# Patient Record
Sex: Female | Born: 1945 | Race: White | Hispanic: No | Marital: Married | Smoking: Never smoker
Health system: Southern US, Community
[De-identification: ages and names within clinical notes are randomized; demographics above are authoritative.]

## PROBLEM LIST (undated history)

## (undated) DIAGNOSIS — C801 Malignant (primary) neoplasm, unspecified: Secondary | ICD-10-CM

## (undated) DIAGNOSIS — I10 Essential (primary) hypertension: Secondary | ICD-10-CM

## (undated) DIAGNOSIS — C569 Malignant neoplasm of unspecified ovary: Secondary | ICD-10-CM

## (undated) DIAGNOSIS — E119 Type 2 diabetes mellitus without complications: Secondary | ICD-10-CM

## (undated) DIAGNOSIS — I499 Cardiac arrhythmia, unspecified: Secondary | ICD-10-CM

## (undated) DIAGNOSIS — E785 Hyperlipidemia, unspecified: Secondary | ICD-10-CM

## (undated) DIAGNOSIS — I2510 Atherosclerotic heart disease of native coronary artery without angina pectoris: Secondary | ICD-10-CM

## (undated) HISTORY — PX: CARDIAC CATHETERIZATION: SHX172

---
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
## 2013-07-01 DIAGNOSIS — C569 Malignant neoplasm of unspecified ovary: Secondary | ICD-10-CM

## 2013-07-01 HISTORY — DX: Malignant neoplasm of unspecified ovary: C56.9

## 2013-07-01 HISTORY — PX: ABDOMINAL HYSTERECTOMY: SHX81

## 2019-01-06 DIAGNOSIS — I499 Cardiac arrhythmia, unspecified: Secondary | ICD-10-CM

## 2019-01-06 HISTORY — DX: Cardiac arrhythmia, unspecified: I49.9

## 2019-01-06 HISTORY — PX: CORONARY ARTERY BYPASS GRAFT: SHX141

## 2019-06-17 ENCOUNTER — Telehealth (HOSPITAL_COMMUNITY): Payer: Self-pay

## 2019-06-17 NOTE — Telephone Encounter (Signed)
Called patient to see if she is interested in the Cardiac Rehab Program. Patient expressed interest. Explained scheduling process and went over insurance, patient verbalized understanding.   Pt will come in for orientation on 06/30/2019 @ 2PM and will attend the 315PM class.  Mailed letter

## 2019-06-17 NOTE — Telephone Encounter (Signed)
Pt insurance is active and benefits verified through Medicare A/B. Co-pay $0.00, DED $198.00/$198.00 met, out of pocket $0.00/$0.00 met, co-insurance 20%. No pre-authorization required. Passport, 06/17/2019 @ 10:16AM, VEH#[id number]  Pt 2ndary insurance [employer] [facility] is out of network, [facility] 06/17/2019. TML#[id number]

## 2019-06-25 ENCOUNTER — Telehealth (HOSPITAL_COMMUNITY): Payer: Self-pay | Admitting: Pharmacist

## 2019-06-25 NOTE — Telephone Encounter (Signed)
Cardiac Rehab Medication Review by a Pharmacist  Does the patient feel that his/her medications are working for him/her?  no  Has the patient been experiencing any side effects to the medications prescribed?  no  Does the patient measure his/her own blood pressure or blood glucose at home?  yes   Does the patient have any problems obtaining medications due to transportation or finances?   no  Understanding of regimen: good Understanding of indications: good Potential of compliance: good  Pharmacist comments: Patient has concerns regarding her metoprolol succinate and atorvastatin and prefers asking her cardiologist her questions.  Richardine Service, PharmD PGY1 Pharmacy Resident Phone: [phone] 06/25/2019  4:46 PM

## 2019-06-30 ENCOUNTER — Encounter (HOSPITAL_COMMUNITY): Payer: Self-pay

## 2019-06-30 ENCOUNTER — Encounter (HOSPITAL_COMMUNITY)
Admission: RE | Admit: 2019-06-30 | Discharge: 2019-06-30 | Disposition: A | Discharge status: Home / Self Care | Payer: Medicare Other | Source: Ambulatory Visit | Attending: Cardiology | Admitting: Cardiology

## 2019-06-30 ENCOUNTER — Other Ambulatory Visit: Payer: Self-pay

## 2019-06-30 VITALS — Ht 59.25 in | Wt 178.6 lb

## 2019-06-30 DIAGNOSIS — Z951 Presence of aortocoronary bypass graft: Secondary | ICD-10-CM | POA: Insufficient documentation

## 2019-06-30 HISTORY — DX: Essential (primary) hypertension: I10

## 2019-06-30 HISTORY — DX: Atherosclerotic heart disease of native coronary artery without angina pectoris: I25.10

## 2019-06-30 HISTORY — DX: Hyperlipidemia, unspecified: E78.5

## 2019-06-30 HISTORY — DX: Type 2 diabetes mellitus without complications: E11.9

## 2019-06-30 HISTORY — DX: Malignant (primary) neoplasm, unspecified: C80.1

## 2019-06-30 NOTE — Progress Notes (Signed)
Cardiac Individual Treatment Plan  Patient Details  Name: Jenny Peters MRN: [id number] Date of Birth: 09-16-1946 Referring Provider:     CARDIAC REHAB PHASE II ORIENTATION from 06/30/2019 in [place]  Referring Provider  Dr. Radford Pax      Initial Encounter Date:    CARDIAC REHAB PHASE II ORIENTATION from 06/30/2019 in [place]  Date  06/30/19      Visit Diagnosis: S/P CABG x 3  Patient's Home Medications on Admission:  Current Outpatient Medications:  .  apixaban (ELIQUIS) 5 MG TABS tablet, Take 5 mg by mouth 2 (two) times daily., Disp: , Rfl:  .  Ascorbic Acid (VITAMIN C) 1000 MG tablet, Take 1,000 mg by mouth daily., Disp: , Rfl:  .  aspirin 81 MG chewable tablet, Chew 81 mg by mouth daily., Disp: , Rfl:  .  atorvastatin (LIPITOR) 80 MG tablet, Take 80 mg by mouth daily., Disp: , Rfl:  .  Biotin 1 MG CAPS, Take 1,000 mcg by mouth daily., Disp: , Rfl:  .  CAPSICUM, CAYENNE, PO, Take 1 capsule by mouth daily., Disp: , Rfl:  .  cholecalciferol (VITAMIN D3) 25 MCG (1000 UT) tablet, Take 1,000 Units by mouth daily., Disp: , Rfl:  .  Cinnamon Bark POWD, Take 500 mg by mouth daily., Disp: , Rfl:  .  furosemide (LASIX) 40 MG tablet, Take 40 mg by mouth 2 (two) times daily., Disp: , Rfl:  .  insulin aspart (NOVOLOG) 100 UNIT/ML injection, Inject 2-9 Units into the skin 3 (three) times daily before meals. Sliding scale - usually uses 2-3 units TID, Disp: , Rfl:  .  insulin glargine (LANTUS) 100 UNIT/ML injection, Inject 30 Units into the skin daily., Disp: , Rfl:  .  lisinopril (ZESTRIL) 10 MG tablet, Take 10 mg by mouth daily., Disp: , Rfl:  .  metoprolol succinate (TOPROL-XL) 50 MG 24 hr tablet, Take 50 mg by mouth every morning. Take with or immediately following a meal., Disp: , Rfl:   Past Medical History: Past Medical History:  Diagnosis Date  . Arrhythmia 01/06/2019   Afib  . Cancer ([place])   . Coronary artery  disease   . Diabetes mellitus without complication ([place])   . Hyperlipidemia   . Hypertension   . Ovarian cancer ([place]) 07/2013    Tobacco Use: Social History   Tobacco Use  Smoking Status Never Smoker  Smokeless Tobacco Never Used    Labs: Recent Review Flowsheet Data    There is no flowsheet data to display.      Capillary Blood Glucose: No results found for: GLUCAP   Exercise Target Goals: Exercise Program Goal: Individual exercise prescription set using results from initial 6 min walk test and THRR while considering  patient's activity barriers and safety.   Exercise Prescription Goal: Initial exercise prescription builds to 30-45 minutes a day of aerobic activity, 2-3 days per week.  Home exercise guidelines will be given to patient during program as part of exercise prescription that the participant will acknowledge.  Activity Barriers & Risk Stratification: Activity Barriers & Cardiac Risk Stratification - 06/30/19 1523      Activity Barriers & Cardiac Risk Stratification   Activity Barriers  Back Problems;Joint Problems;Balance Concerns;Deconditioning;Muscular Weakness;Other (comment)    Comments  Right rotator cuff repair.    Cardiac Risk Stratification  High       6 Minute Walk: 6 Minute Walk    Row Name 06/30/19 1520  6 Minute Walk   Phase  Initial     Distance  600 feet     Walk Time  4.3 minutes Discontinued at 4:18.     # of Rest Breaks  0     MPH  1.5     METS  1.7     RPE  13     Perceived Dyspnea   0     VO2 Peak  6.2     Symptoms  Yes (comment)     Comments  5/10 back pain     Resting HR  81 bpm     Resting BP  108/62     Resting Oxygen Saturation   97 %     Exercise Oxygen Saturation  during 6 min walk  100 %     Max Ex. HR  151 bpm     Max Ex. BP  144/80     2 Minute Post BP  110/66        Oxygen Initial Assessment:   Oxygen Re-Evaluation:   Oxygen Discharge (Final Oxygen Re-Evaluation):   Initial Exercise  Prescription: Initial Exercise Prescription - 06/30/19 1500      Date of Initial Exercise RX and Referring Provider   Date  06/30/19    Referring Provider  Dr. Radford Pax    Expected Discharge Date  08/28/19      NuStep   Level  2    SPM  75    Minutes  30    METs  1.5      Prescription Details   Frequency (times per week)  3    Duration  Progress to 30 minutes of continuous aerobic without signs/symptoms of physical distress      Intensity   THRR 40-80% of Max Heartrate  60-120    Ratings of Perceived Exertion  11-13      Progression   Progression  Continue to progress workloads to maintain intensity without signs/symptoms of physical distress.      Resistance Training   Training Prescription  Yes    Weight  1 lb.    Reps  10-15       Perform Capillary Blood Glucose checks as needed.  Exercise Prescription Changes:   Exercise Comments:   Exercise Goals and Review:  Exercise Goals    Row Name 06/30/19 1525             Exercise Goals   Increase Physical Activity  Yes       Intervention  Provide advice, education, support and counseling about physical activity/exercise needs.;Develop an individualized exercise prescription for aerobic and resistive training based on initial evaluation findings, risk stratification, comorbidities and participant's personal goals.       Expected Outcomes  Short Term: Attend rehab on a regular basis to increase amount of physical activity.;Long Term: Add in home exercise to make exercise part of routine and to increase amount of physical activity.;Long Term: Exercising regularly at least 3-5 days a week.       Increase Strength and Stamina  Yes       Intervention  Provide advice, education, support and counseling about physical activity/exercise needs.;Develop an individualized exercise prescription for aerobic and resistive training based on initial evaluation findings, risk stratification, comorbidities and participant's personal goals.        Expected Outcomes  Short Term: Increase workloads from initial exercise prescription for resistance, speed, and METs.;Short Term: Perform resistance training exercises routinely during rehab and add in resistance training at  home;Long Term: Improve cardiorespiratory fitness, muscular endurance and strength as measured by increased METs and functional capacity (6MWT)       Able to understand and use rate of perceived exertion (RPE) scale  Yes       Intervention  Provide education and explanation on how to use RPE scale       Expected Outcomes  Short Term: Able to use RPE daily in rehab to express subjective intensity level;Long Term:  Able to use RPE to guide intensity level when exercising independently       Knowledge and understanding of Target Heart Rate Range (THRR)  Yes       Intervention  Provide education and explanation of THRR including how the numbers were predicted and where they are located for reference       Expected Outcomes  Short Term: Able to state/look up THRR;Long Term: Able to use THRR to govern intensity when exercising independently;Short Term: Able to use daily as guideline for intensity in rehab       Able to check pulse independently  Yes       Intervention  Provide education and demonstration on how to check pulse in carotid and radial arteries.;Review the importance of being able to check your own pulse for safety during independent exercise       Expected Outcomes  Short Term: Able to explain why pulse checking is important during independent exercise;Long Term: Able to check pulse independently and accurately       Understanding of Exercise Prescription  Yes       Intervention  Provide education, explanation, and written materials on patient's individual exercise prescription       Expected Outcomes  Short Term: Able to explain program exercise prescription;Long Term: Able to explain home exercise prescription to exercise independently          Exercise Goals  Re-Evaluation :   Discharge Exercise Prescription (Final Exercise Prescription Changes):   Nutrition:  Target Goals: Understanding of nutrition guidelines, daily intake of sodium 1500mg , cholesterol 200mg , calories 30% from fat and 7% or less from saturated fats, daily to have 5 or more servings of fruits and vegetables.  Biometrics: Pre Biometrics - 06/30/19 1525      Pre Biometrics   Height  4' 11.25" (1.505 m)    Weight  81 kg    Waist Circumference  46 inches    Hip Circumference  46.5 inches    Waist to Hip Ratio  0.99 %    BMI (Calculated)  35.76    Triceps Skinfold  33 mm    % Body Fat  49.4 %    Grip Strength  27 kg    Flexibility  14 in    Single Leg Stand  13.5 seconds        Nutrition Therapy Plan and Nutrition Goals:   Nutrition Assessments:   Nutrition Goals Re-Evaluation:   Nutrition Goals Re-Evaluation:   Nutrition Goals Discharge (Final Nutrition Goals Re-Evaluation):   Psychosocial: Target Goals: Acknowledge presence or absence of significant depression and/or stress, maximize coping skills, provide positive support system. Participant is able to verbalize types and ability to use techniques and skills needed for reducing stress and depression.  Initial Review & Psychosocial Screening: Initial Psych Review & Screening - 06/30/19 1610      Initial Review   Current issues with  Current Stress Concerns    Source of Stress Concerns  Family    Comments  Patient admits to stress  secondary to caring for her husband who has dementia. They recently moved to [facility] to be closer to her daughter and have had some stress secondary to moving as well. 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 She continues to maintain a positive attitude.      Family Dynamics   Good Support System?  Yes   children and sister     Barriers   Psychosocial  barriers to participate in program  The patient should benefit from training in stress management and relaxation.;Psychosocial barriers identified (see note)      Screening Interventions   Interventions  Encouraged to exercise;To provide support and resources with identified psychosocial needs    Expected Outcomes  Short Term goal: Utilizing psychosocial counselor, staff and physician to assist with identification of specific Stressors or current issues interfering with healing process. Setting desired goal for each stressor or current issue identified.;Long Term Goal: Stressors or current issues are controlled or eliminated.;Short Term goal: Identification and review with participant of any Quality of Life or Depression concerns found by scoring the questionnaire.;Long Term goal: The participant improves quality of Life and PHQ9 Scores as seen by post scores and/or verbalization of changes       Quality of Life Scores:  Scores of 19 and below usually indicate a poorer quality of life in these areas.  A difference of  2-3 points is a clinically meaningful difference.  A difference of 2-3 points in the total score of the Quality of Life Index has been associated with significant improvement in overall quality of life, self-image, physical symptoms, and general health in studies assessing change in quality of life.  PHQ-9: Recent Review Flowsheet Data    Depression screen [facility] 2/9 06/30/2019   Decreased Interest 0   Down, Depressed, Hopeless 1   PHQ - 2 Score 1     Interpretation of Total Score  Total Score Depression Severity:  1-4 = Minimal depression, 5-9 = Mild depression, 10-14 = Moderate depression, 15-19 = Moderately severe depression, 20-27 = Severe depression   Psychosocial Evaluation and Intervention:   Psychosocial Re-Evaluation:   Psychosocial Discharge (Final Psychosocial Re-Evaluation):   Vocational Rehabilitation: Provide vocational rehab assistance to qualifying  candidates.   Vocational Rehab Evaluation & Intervention: Vocational Rehab - 06/30/19 1548      Initial Vocational Rehab Evaluation & Intervention   Assessment shows need for Vocational Rehabilitation  No       Education: Education Goals: Education classes will be provided on a weekly basis, covering required topics. Participant will state understanding/return demonstration of topics presented.  Learning Barriers/Preferences: Learning Barriers/Preferences - 06/30/19 1527      Learning Barriers/Preferences   Learning Barriers  Sight    Learning Preferences  Written Material       Education Topics: Count Your Pulse:  -Group instruction provided by verbal instruction, demonstration, patient participation and written materials to support subject.  Instructors address importance of being able to find your pulse and how to count your pulse when at home without a heart monitor.  Patients get hands on experience counting their pulse with staff help and individually.   Heart Attack, Angina, and Risk Factor Modification:  -Group instruction provided by verbal instruction, video, and written materials to support subject.  Instructors address signs and symptoms of angina and heart attacks.    Also discuss risk factors for heart disease  and how to make changes to improve heart health risk factors.   Functional Fitness:  -Group instruction provided by verbal instruction, demonstration, patient participation, and written materials to support subject.  Instructors address safety measures for doing things around the house.  Discuss how to get up and down off the floor, how to pick things up properly, how to safely get out of a chair without assistance, and balance training.   Meditation and Mindfulness:  -Group instruction provided by verbal instruction, patient participation, and written materials to support subject.  Instructor addresses importance of mindfulness and meditation practice to help  reduce stress and improve awareness.  Instructor also leads participants through a meditation exercise.    Stretching for Flexibility and Mobility:  -Group instruction provided by verbal instruction, patient participation, and written materials to support subject.  Instructors lead participants through series of stretches that are designed to increase flexibility thus improving mobility.  These stretches are additional exercise for major muscle groups that are typically performed during regular warm up and cool down.   Hands Only CPR:  -Group verbal, video, and participation provides a basic overview of AHA guidelines for community CPR. Role-play of emergencies allow participants the opportunity to practice calling for help and chest compression technique with discussion of AED use.   Hypertension: -Group verbal and written instruction that provides a basic overview of hypertension including the most recent diagnostic guidelines, risk factor reduction with self-care instructions and medication management.    Nutrition I class: Heart Healthy Eating:  -Group instruction provided by PowerPoint slides, verbal discussion, and written materials to support subject matter. The instructor gives an explanation and review of the Therapeutic Lifestyle Changes diet recommendations, which includes a discussion on lipid goals, dietary fat, sodium, fiber, plant stanol/sterol esters, sugar, and the components of a well-balanced, healthy diet.   Nutrition II class: Lifestyle Skills:  -Group instruction provided by PowerPoint slides, verbal discussion, and written materials to support subject matter. The instructor gives an explanation and review of label reading, grocery shopping for heart health, heart healthy recipe modifications, and ways to make healthier choices when eating out.   Diabetes Question & Answer:  -Group instruction provided by PowerPoint slides, verbal discussion, and written materials to  support subject matter. The instructor gives an explanation and review of diabetes co-morbidities, pre- and post-prandial blood glucose goals, pre-exercise blood glucose goals, signs, symptoms, and treatment of hypoglycemia and hyperglycemia, and foot care basics.   Diabetes Blitz:  -Group instruction provided by PowerPoint slides, verbal discussion, and written materials to support subject matter. The instructor gives an explanation and review of the physiology behind type 1 and type 2 diabetes, diabetes medications and rational behind using different medications, pre- and post-prandial blood glucose recommendations and Hemoglobin A1c goals, diabetes diet, and exercise including blood glucose guidelines for exercising safely.    Portion Distortion:  -Group instruction provided by PowerPoint slides, verbal discussion, written materials, and food models to support subject matter. The instructor gives an explanation of serving size versus portion size, changes in portions sizes over the last 20 years, and what consists of a serving from each food group.   Stress Management:  -Group instruction provided by verbal instruction, video, and written materials to support subject matter.  Instructors review role of stress in heart disease and how to cope with stress positively.     Exercising on Your Own:  -Group instruction provided by verbal instruction, power point, and written materials to support subject.  Instructors discuss benefits  of exercise, components of exercise, frequency and intensity of exercise, and end points for exercise.  Also discuss use of nitroglycerin and activating EMS.  Review options of places to exercise outside of rehab.  Review guidelines for sex with heart disease.   Cardiac Drugs I:  -Group instruction provided by verbal instruction and written materials to support subject.  Instructor reviews cardiac drug classes: antiplatelets, anticoagulants, beta blockers, and statins.   Instructor discusses reasons, side effects, and lifestyle considerations for each drug class.   Cardiac Drugs II:  -Group instruction provided by verbal instruction and written materials to support subject.  Instructor reviews cardiac drug classes: angiotensin converting enzyme inhibitors (ACE-I), angiotensin II receptor blockers (ARBs), nitrates, and calcium channel blockers.  Instructor discusses reasons, side effects, and lifestyle considerations for each drug class.   Anatomy and Physiology of the Circulatory System:  Group verbal and written instruction and models provide basic cardiac anatomy and physiology, with the coronary electrical and arterial systems. Review of: AMI, Angina, Valve disease, Heart Failure, Peripheral Artery Disease, Cardiac Arrhythmia, Pacemakers, and the ICD.   Other Education:  -Group or individual verbal, written, or video instructions that support the educational goals of the cardiac rehab program.   Holiday Eating Survival Tips:  -Group instruction provided by PowerPoint slides, verbal discussion, and written materials to support subject matter. The instructor gives patients tips, tricks, and techniques to help them not only survive but enjoy the holidays despite the onslaught of food that accompanies the holidays.   Knowledge Questionnaire Score:   Core Components/Risk Factors/Patient Goals at Admission: Personal Goals and Risk Factors at Admission - 06/30/19 1526      Core Components/Risk Factors/Patient Goals on Admission    Weight Management  Yes;Obesity;Weight Maintenance;Weight Loss    Intervention  Weight Management: Develop a combined nutrition and exercise program designed to reach desired caloric intake, while maintaining appropriate intake of nutrient and fiber, sodium and fats, and appropriate energy expenditure required for the weight goal.;Weight Management: Provide education and appropriate resources to help participant work on and attain  dietary goals.;Weight Management/Obesity: Establish reasonable short term and long term weight goals.;Obesity: Provide education and appropriate resources to help participant work on and attain dietary goals.    Admit Weight  178 lb 9.2 oz (81 kg)    Expected Outcomes  Short Term: Continue to assess and modify interventions until short term weight is achieved;Long Term: Adherence to nutrition and physical activity/exercise program aimed toward attainment of established weight goal;Weight Maintenance: Understanding of the daily nutrition guidelines, which includes 25-35% calories from fat, 7% or less cal from saturated fats, less than 200mg  cholesterol, less than 1.5gm of sodium, & 5 or more servings of fruits and vegetables daily;Weight Loss: Understanding of general recommendations for a balanced deficit meal plan, which promotes 1-2 lb weight loss per week and includes a negative energy balance of [phone] kcal/d;Understanding recommendations for meals to include 15-35% energy as protein, 25-35% energy from fat, 35-60% energy from carbohydrates, less than 200mg  of dietary cholesterol, 20-35 gm of total fiber daily;Understanding of distribution of calorie intake throughout the day with the consumption of 4-5 meals/snacks    Diabetes  Yes    Intervention  Provide education about signs/symptoms and action to take for hypo/hyperglycemia.;Provide education about proper nutrition, including hydration, and aerobic/resistive exercise prescription along with prescribed medications to achieve blood glucose in normal ranges: Fasting glucose 65-99 mg/dL    Expected Outcomes  Short Term: Participant verbalizes understanding of the signs/symptoms and immediate care  of hyper/hypoglycemia, proper foot care and importance of medication, aerobic/resistive exercise and nutrition plan for blood glucose control.;Long Term: Attainment of HbA1C < 7%.    Hypertension  Yes    Intervention  Provide education on lifestyle  modifcations including regular physical activity/exercise, weight management, moderate sodium restriction and increased consumption of fresh fruit, vegetables, and low fat dairy, alcohol moderation, and smoking cessation.;Monitor prescription use compliance.    Expected Outcomes  Short Term: Continued assessment and intervention until BP is < 140/79mm HG in hypertensive participants. < 130/83mm HG in hypertensive participants with diabetes, heart failure or chronic kidney disease.;Long Term: Maintenance of blood pressure at goal levels.    Lipids  Yes    Intervention  Provide education and support for participant on nutrition & aerobic/resistive exercise along with prescribed medications to achieve LDL 70mg , HDL >40mg .    Expected Outcomes  Short Term: Participant states understanding of desired cholesterol values and is compliant with medications prescribed. Participant is following exercise prescription and nutrition guidelines.;Long Term: Cholesterol controlled with medications as prescribed, with individualized exercise RX and with personalized nutrition plan. Value goals: LDL < 70mg , HDL > 40 mg.    Stress  Yes    Intervention  Offer individual and/or small group education and counseling on adjustment to heart disease, stress management and health-related lifestyle change. Teach and support self-help strategies.;Refer participants experiencing significant psychosocial distress to appropriate mental health specialists for further evaluation and treatment. When possible, include family members and significant others in education/counseling sessions.    Expected Outcomes  Short Term: Participant demonstrates changes in health-related behavior, relaxation and other stress management skills, ability to obtain effective social support, and compliance with psychotropic medications if prescribed.;Long Term: Emotional wellbeing is indicated by absence of clinically significant psychosocial distress or social  isolation.       Core Components/Risk Factors/Patient Goals Review:    Core Components/Risk Factors/Patient Goals at Discharge (Final Review):    ITP Comments: ITP Comments    Row Name 06/30/19 1512           ITP Comments  Dr. Fransico Him Cardiac Rehab Medical Director          Comments: Patient attended orientation on 06/30/2019 to review rules and guidelines for program. Completed 6 minute walk test, Intitial ITP, and exercise prescription.  VSS. Telemetry-NSR with frequent PVCs during exercise that dissipated during rest. Patient extremely deconditioned and is currently not exercising at home. Asymptomatic. Safety measures and social distancing in place per CDC guidelines.

## 2019-07-06 ENCOUNTER — Ambulatory Visit (HOSPITAL_COMMUNITY): Payer: Medicare Other

## 2019-07-06 ENCOUNTER — Encounter (HOSPITAL_COMMUNITY)
Admission: RE | Admit: 2019-07-06 | Discharge: 2019-07-06 | Disposition: A | Discharge status: Home / Self Care | Payer: Medicare Other | Source: Ambulatory Visit | Attending: Cardiology | Admitting: Cardiology

## 2019-07-06 ENCOUNTER — Other Ambulatory Visit: Payer: Self-pay

## 2019-07-06 VITALS — BP 124/74 | Temp 98.3°F | Wt 177.2 lb

## 2019-07-06 DIAGNOSIS — E119 Type 2 diabetes mellitus without complications: Secondary | ICD-10-CM | POA: Insufficient documentation

## 2019-07-06 DIAGNOSIS — Z7982 Long term (current) use of aspirin: Secondary | ICD-10-CM | POA: Diagnosis not present

## 2019-07-06 DIAGNOSIS — E785 Hyperlipidemia, unspecified: Secondary | ICD-10-CM | POA: Diagnosis not present

## 2019-07-06 DIAGNOSIS — I10 Essential (primary) hypertension: Secondary | ICD-10-CM | POA: Insufficient documentation

## 2019-07-06 DIAGNOSIS — I2510 Atherosclerotic heart disease of native coronary artery without angina pectoris: Secondary | ICD-10-CM | POA: Diagnosis not present

## 2019-07-06 DIAGNOSIS — Z951 Presence of aortocoronary bypass graft: Secondary | ICD-10-CM | POA: Insufficient documentation

## 2019-07-06 DIAGNOSIS — Z7901 Long term (current) use of anticoagulants: Secondary | ICD-10-CM | POA: Insufficient documentation

## 2019-07-06 DIAGNOSIS — Z48812 Encounter for surgical aftercare following surgery on the circulatory system: Secondary | ICD-10-CM | POA: Insufficient documentation

## 2019-07-06 DIAGNOSIS — Z79899 Other long term (current) drug therapy: Secondary | ICD-10-CM | POA: Diagnosis not present

## 2019-07-06 DIAGNOSIS — I4891 Unspecified atrial fibrillation: Secondary | ICD-10-CM | POA: Insufficient documentation

## 2019-07-06 DIAGNOSIS — Z794 Long term (current) use of insulin: Secondary | ICD-10-CM | POA: Diagnosis not present

## 2019-07-06 LAB — GLUCOSE, CAPILLARY
Glucose-Capillary: 84 mg/dL (ref 70–99)
Glucose-Capillary: 107 mg/dL — ABNORMAL HIGH (ref 70–99)
Glucose-Capillary: 78 mg/dL (ref 70–99)

## 2019-07-06 NOTE — Progress Notes (Signed)
Incomplete Session Note  Patient Details  Name: Jenny Peters MRN: [id number] Date of Birth: [birth year] Referring Provider:     [place] from 06/30/2019 in [place]  Referring Provider  Dr. Rudean Hitt did not complete her first rehab session. Ms. Papka presented to CR with a blood sugar of 84. This is below safe parameters for exercise. She was given peanut butter crackers and lemonade to drink. Repeat CBG 78, increased to 107 with additional lemonade. Ms. Tromp states she has not had anything to eat since this morning. She checked her blood sugar prior to leaving her home and it was 116. She did not take her lunch time SS insulin. She had yogurt and fruit for breakfast. She is instructed to eat a lite meal consisting of protein and carbohydrates just prior to exercise. Ms. Rippetoe verbalized understanding. She was discharged with stable vitals and asymptomatic. She is instructed to eat as soon as she gets home. Danyia Borunda E. Rollene Rotunda, RN BSN

## 2019-07-08 ENCOUNTER — Ambulatory Visit (HOSPITAL_COMMUNITY): Payer: Medicare Other

## 2019-07-08 ENCOUNTER — Encounter (HOSPITAL_COMMUNITY)
Admission: RE | Admit: 2019-07-08 | Discharge: 2019-07-08 | Disposition: A | Discharge status: Home / Self Care | Payer: Medicare Other | Source: Ambulatory Visit | Attending: Cardiology | Admitting: Cardiology

## 2019-07-08 ENCOUNTER — Other Ambulatory Visit: Payer: Self-pay

## 2019-07-08 DIAGNOSIS — Z951 Presence of aortocoronary bypass graft: Secondary | ICD-10-CM

## 2019-07-08 DIAGNOSIS — Z48812 Encounter for surgical aftercare following surgery on the circulatory system: Secondary | ICD-10-CM | POA: Diagnosis not present

## 2019-07-08 LAB — GLUCOSE, CAPILLARY
Glucose-Capillary: 119 mg/dL — ABNORMAL HIGH (ref 70–99)
Glucose-Capillary: 96 mg/dL (ref 70–99)

## 2019-07-08 NOTE — Progress Notes (Signed)
Daily Session Note  Patient Details  Name: Jenny Peters MRN: [id number] Date of Birth: 01-24-1946 Referring Provider:     CARDIAC REHAB PHASE II ORIENTATION from 06/30/2019 in [place]  Referring Provider  Dr. Radford Pax      Encounter Date: 07/08/2019  Check In:   Capillary Blood Glucose: No results found for this or any previous visit (from the past 24 hour(s)).    Social History   Tobacco Use  Smoking Status Never Smoker  Smokeless Tobacco Never Used    Goals Met:  Exercise tolerated well No report of cardiac concerns or symptoms  Goals Unmet:  Not Applicable  Comments: Pt started cardiac rehab today.  Pt tolerated light exercise without difficulty. C/O chronic shoulder pain with upper body exercise and back pain with post exercise stretches. Patient relieved with rest VSS, telemetry-NSR, asymptomatic.  Medication list reconciled. Pt denies barriers to medicaiton compliance.  PSYCHOSOCIAL ASSESSMENT:  PHQ-0. Pt exhibits positive coping skills, hopeful outlook with supportive family. No psychosocial needs identified at this time, no psychosocial interventions necessary.  Pt oriented to exercise equipment and routine.    Understanding verbalized. Patient encouraged to increase her complex carb intake prior to exercise to prevent blood sugar drop during exercise.   Dr. Fransico Him is Medical Director for Cardiac Rehab at [facility].

## 2019-07-09 ENCOUNTER — Telehealth: Payer: Self-pay

## 2019-07-09 NOTE — Telephone Encounter (Signed)
Forwarded to PCP, [place],MD  Pt states she has not been taking her sliding scale insulin most days d/t fear if her blood sugar dropping while she is in cardiac rehab. Advised pt to reach put to PCP for possible insulin dose changes. Pt verbalized understanding.

## 2019-07-10 ENCOUNTER — Ambulatory Visit (HOSPITAL_COMMUNITY): Payer: Medicare Other

## 2019-07-10 ENCOUNTER — Encounter (HOSPITAL_COMMUNITY)
Admission: RE | Admit: 2019-07-10 | Discharge: 2019-07-10 | Disposition: A | Discharge status: Home / Self Care | Payer: Medicare Other | Source: Ambulatory Visit | Attending: Cardiology | Admitting: Cardiology

## 2019-07-10 ENCOUNTER — Other Ambulatory Visit: Payer: Self-pay

## 2019-07-10 DIAGNOSIS — Z48812 Encounter for surgical aftercare following surgery on the circulatory system: Secondary | ICD-10-CM | POA: Diagnosis not present

## 2019-07-10 NOTE — Progress Notes (Signed)
Incomplete Session Note  Patient Details  Name: Jenny Peters MRN: [id number] Date of Birth: 1945/11/14 Referring Provider:     [place] from 06/30/2019 in [place]  Referring Provider  Dr. Rudean Hitt did not complete her rehab session. Jenny Peters presented to CR exercise with CBG 88. This is below acceptable parameters of 110 for exercise. Patient acknowledged eating avocado and bacon sandwich for lunch. CR RN contacted patients PCP for consultation. Spoke with [place] who will discuss with PCP and contact patient. Patient discharged to home without exercise in stable condition without complaints. Patient was provided simple sugar and peanut butter crackers prior to discharge. Will follow up with patient next week. Veola Cafaro E. Rollene Rotunda, RN BSN

## 2019-07-13 ENCOUNTER — Other Ambulatory Visit: Payer: Self-pay

## 2019-07-13 ENCOUNTER — Encounter (HOSPITAL_COMMUNITY)
Admission: RE | Admit: 2019-07-13 | Discharge: 2019-07-13 | Disposition: A | Discharge status: Home / Self Care | Payer: Medicare Other | Source: Ambulatory Visit | Attending: Cardiology | Admitting: Cardiology

## 2019-07-13 ENCOUNTER — Ambulatory Visit (HOSPITAL_COMMUNITY): Payer: Medicare Other

## 2019-07-13 ENCOUNTER — Telehealth: Payer: Self-pay | Admitting: *Deleted

## 2019-07-13 DIAGNOSIS — Z951 Presence of aortocoronary bypass graft: Secondary | ICD-10-CM

## 2019-07-13 DIAGNOSIS — Z48812 Encounter for surgical aftercare following surgery on the circulatory system: Secondary | ICD-10-CM | POA: Diagnosis not present

## 2019-07-13 LAB — GLUCOSE, CAPILLARY
Glucose-Capillary: 88 mg/dL (ref 70–99)
Glucose-Capillary: 122 mg/dL — ABNORMAL HIGH (ref 70–99)
Glucose-Capillary: 130 mg/dL — ABNORMAL HIGH (ref 70–99)

## 2019-07-13 NOTE — Telephone Encounter (Signed)
-----   Message from Nuala Alpha, LPN sent at [id number] 11:58 AM EDT -----  ----- Message ----- From: Sueanne Margarita, MD Sent: 07/13/2019  10:06 AM EDT To: [facility] Triage  Elevated BS at Cardiac Rehab - please forward to primary MD

## 2019-07-13 NOTE — Telephone Encounter (Signed)
No PCP listed. I placed call to pt to ask her who she sees for primary care.  Left message to call office.

## 2019-07-14 ENCOUNTER — Telehealth: Payer: Self-pay

## 2019-07-14 NOTE — Telephone Encounter (Signed)
Notes recorded by Frederik Schmidt, RN on 07/14/2019 at 9:08 AM EDT  The patient has been notified of the lab result and verbalized understanding. All questions (if any) were answered.  Frederik Schmidt, RN 07/14/2019 9:08 AM

## 2019-07-14 NOTE — Telephone Encounter (Signed)
-----   Message from Sueanne Margarita, MD sent at 07/13/2019  6:06 PM EDT ----- Elevated BS at Cardiac Rehab - please forward to primary MD

## 2019-07-15 ENCOUNTER — Encounter (HOSPITAL_COMMUNITY)
Admission: RE | Admit: 2019-07-15 | Discharge: 2019-07-15 | Disposition: A | Discharge status: Home / Self Care | Payer: Medicare Other | Source: Ambulatory Visit | Attending: Cardiology | Admitting: Cardiology

## 2019-07-15 ENCOUNTER — Other Ambulatory Visit: Payer: Self-pay

## 2019-07-15 ENCOUNTER — Ambulatory Visit (HOSPITAL_COMMUNITY): Payer: Medicare Other

## 2019-07-15 DIAGNOSIS — Z48812 Encounter for surgical aftercare following surgery on the circulatory system: Secondary | ICD-10-CM | POA: Diagnosis not present

## 2019-07-15 DIAGNOSIS — Z951 Presence of aortocoronary bypass graft: Secondary | ICD-10-CM

## 2019-07-16 NOTE — Progress Notes (Signed)
Cardiac Individual Treatment Plan  Patient Details  Name: Jenny Peters MRN: [id number] Date of Birth: May 29, 1946 Referring Provider:     CARDIAC REHAB PHASE II ORIENTATION from 06/30/2019 in [facility]  Referring Provider  Dr. Radford Pax      Initial Encounter Date:    CARDIAC REHAB PHASE II ORIENTATION from 06/30/2019 in [facility]  Date  06/30/19      Visit Diagnosis: S/P CABG x 3  Patient's Home Medications on Admission:  Current Outpatient Medications:  .  apixaban (ELIQUIS) 5 MG TABS tablet, Take 5 mg by mouth 2 (two) times daily., Disp: , Rfl:  .  Ascorbic Acid (VITAMIN C) 1000 MG tablet, Take 1,000 mg by mouth daily., Disp: , Rfl:  .  aspirin 81 MG chewable tablet, Chew 81 mg by mouth daily., Disp: , Rfl:  .  atorvastatin (LIPITOR) 80 MG tablet, Take 80 mg by mouth daily., Disp: , Rfl:  .  Biotin 1 MG CAPS, Take 1,000 mcg by mouth daily., Disp: , Rfl:  .  CAPSICUM, CAYENNE, PO, Take 1 capsule by mouth daily., Disp: , Rfl:  .  cholecalciferol (VITAMIN D3) 25 MCG (1000 UT) tablet, Take 1,000 Units by mouth daily., Disp: , Rfl:  .  Cinnamon Bark POWD, Take 500 mg by mouth daily., Disp: , Rfl:  .  furosemide (LASIX) 40 MG tablet, Take 40 mg by mouth 2 (two) times daily., Disp: , Rfl:  .  insulin aspart (NOVOLOG) 100 UNIT/ML injection, Inject 2-9 Units into the skin 3 (three) times daily before meals. Sliding scale - usually uses 2-3 units TID, Disp: , Rfl:  .  insulin glargine (LANTUS) 100 UNIT/ML injection, Inject 30 Units into the skin daily., Disp: , Rfl:  .  lisinopril (ZESTRIL) 10 MG tablet, Take 10 mg by mouth daily., Disp: , Rfl:  .  metoprolol succinate (TOPROL-XL) 50 MG 24 hr tablet, Take 50 mg by mouth every morning. Take with or immediately following a meal., Disp: , Rfl:   Past Medical History: Past Medical History:  Diagnosis Date  . Arrhythmia 01/06/2019   Afib  . Cancer ([place])   . Coronary artery  disease   . Diabetes mellitus without complication ([place])   . Hyperlipidemia   . Hypertension   . Ovarian cancer ([place]) 07/2013    Tobacco Use: Social History   Tobacco Use  Smoking Status Never Smoker  Smokeless Tobacco Never Used    Labs: Recent Review Flowsheet Data    There is no flowsheet data to display.      Capillary Blood Glucose: Lab Results  Component Value Date   GLUCAP 130 (H) 07/13/2019   GLUCAP 122 (H) 07/13/2019   GLUCAP 88 07/10/2019   GLUCAP 96 07/08/2019   GLUCAP 119 (H) 07/08/2019     Exercise Target Goals: Exercise Program Goal: Individual exercise prescription set using results from initial 6 min walk test and THRR while considering  patient's activity barriers and safety.   Exercise Prescription Goal: Starting with aerobic activity 30 plus minutes a day, 3 days per week for initial exercise prescription. Provide home exercise prescription and guidelines that participant acknowledges understanding prior to discharge.  Activity Barriers & Risk Stratification: Activity Barriers & Cardiac Risk Stratification - 06/30/19 1523      Activity Barriers & Cardiac Risk Stratification   Activity Barriers  Back Problems;Joint Problems;Balance Concerns;Deconditioning;Muscular Weakness;Other (comment)    Comments  Right rotator cuff repair.    Cardiac Risk  Stratification  High       6 Minute Walk: 6 Minute Walk    Row Name 06/30/19 1520         6 Minute Walk   Phase  Initial     Distance  600 feet     Walk Time  4.3 minutes Discontinued at 4:18.     # of Rest Breaks  0     MPH  1.5     METS  1.7     RPE  13     Perceived Dyspnea   0     VO2 Peak  6.2     Symptoms  Yes (comment)     Comments  5/10 back pain     Resting HR  81 bpm     Resting BP  108/62     Resting Oxygen Saturation   97 %     Exercise Oxygen Saturation  during 6 min walk  100 %     Max Ex. HR  151 bpm     Max Ex. BP  144/80     2 Minute Post BP  110/66        Oxygen  Initial Assessment:   Oxygen Re-Evaluation:   Oxygen Discharge (Final Oxygen Re-Evaluation):   Initial Exercise Prescription: Initial Exercise Prescription - 06/30/19 1500      Date of Initial Exercise RX and Referring Provider   Date  06/30/19    Referring Provider  Dr. Radford Pax    Expected Discharge Date  08/28/19      NuStep   Level  2    SPM  75    Minutes  30    METs  1.5      Prescription Details   Frequency (times per week)  3    Duration  Progress to 30 minutes of continuous aerobic without signs/symptoms of physical distress      Intensity   THRR 40-80% of Max Heartrate  60-120    Ratings of Perceived Exertion  11-13      Progression   Progression  Continue to progress workloads to maintain intensity without signs/symptoms of physical distress.      Resistance Training   Training Prescription  Yes    Weight  1 lb.    Reps  10-15       Perform Capillary Blood Glucose checks as needed.  Exercise Prescription Changes: Exercise Prescription Changes    Row Name 07/08/19 1525 07/13/19 1529           Response to Exercise   Blood Pressure (Admit)  96/60  118/70      Blood Pressure (Exercise)  146/80  144/74      Blood Pressure (Exit)  104/66  92/62 110/74 recheck BP      Heart Rate (Admit)  98 bpm  97 bpm      Heart Rate (Exercise)  106 bpm  111 bpm      Heart Rate (Exit)  89 bpm  99 bpm      Rating of Perceived Exertion (Exercise)  12  11      Symptoms  Patient c/o shoulder discomfort.  none      Comments  Patient tolerated low intensity exercise fair with some shoulder discomfort.  -      Duration  Progress to 30 minutes of  aerobic without signs/symptoms of physical distress  Progress to 30 minutes of  aerobic without signs/symptoms of physical distress      Intensity  THRR unchanged  THRR unchanged        Progression   Progression  Continue to progress workloads to maintain intensity without signs/symptoms of physical distress.  Continue to progress  workloads to maintain intensity without signs/symptoms of physical distress.      Average METs  1.8  2        Resistance Training   Training Prescription  No Relaxation day, no weights.  Yes      Weight  -  1lb      Reps  -  10-15      Time  -  10 Minutes        Interval Training   Interval Training  No  No        NuStep   Level  -  -      SPM  -  -      Minutes  -  -      METs  -  -        T5 Nustep   Level  2  2      SPM  75  75      Minutes  30  25      METs  1.8  2         Exercise Comments: Exercise Comments    Row Name 07/08/19 1613 07/15/19 1551         Exercise Comments  Patient tolerated low intensity fair with some shoulder discomfort.  Reviewed METs and goals with patient.         Exercise Goals and Review: Exercise Goals    Row Name 06/30/19 1525             Exercise Goals   Increase Physical Activity  Yes       Intervention  Provide advice, education, support and counseling about physical activity/exercise needs.;Develop an individualized exercise prescription for aerobic and resistive training based on initial evaluation findings, risk stratification, comorbidities and participant's personal goals.       Expected Outcomes  Short Term: Attend rehab on a regular basis to increase amount of physical activity.;Long Term: Add in home exercise to make exercise part of routine and to increase amount of physical activity.;Long Term: Exercising regularly at least 3-5 days a week.       Increase Strength and Stamina  Yes       Intervention  Provide advice, education, support and counseling about physical activity/exercise needs.;Develop an individualized exercise prescription for aerobic and resistive training based on initial evaluation findings, risk stratification, comorbidities and participant's personal goals.       Expected Outcomes  Short Term: Increase workloads from initial exercise prescription for resistance, speed, and METs.;Short Term: Perform  resistance training exercises routinely during rehab and add in resistance training at home;Long Term: Improve cardiorespiratory fitness, muscular endurance and strength as measured by increased METs and functional capacity (6MWT)       Able to understand and use rate of perceived exertion (RPE) scale  Yes       Intervention  Provide education and explanation on how to use RPE scale       Expected Outcomes  Short Term: Able to use RPE daily in rehab to express subjective intensity level;Long Term:  Able to use RPE to guide intensity level when exercising independently       Knowledge and understanding of Target Heart Rate Range (THRR)  Yes       Intervention  Provide education and  explanation of THRR including how the numbers were predicted and where they are located for reference       Expected Outcomes  Short Term: Able to state/look up THRR;Long Term: Able to use THRR to govern intensity when exercising independently;Short Term: Able to use daily as guideline for intensity in rehab       Able to check pulse independently  Yes       Intervention  Provide education and demonstration on how to check pulse in carotid and radial arteries.;Review the importance of being able to check your own pulse for safety during independent exercise       Expected Outcomes  Short Term: Able to explain why pulse checking is important during independent exercise;Long Term: Able to check pulse independently and accurately       Understanding of Exercise Prescription  Yes       Intervention  Provide education, explanation, and written materials on patient's individual exercise prescription       Expected Outcomes  Short Term: Able to explain program exercise prescription;Long Term: Able to explain home exercise prescription to exercise independently          Exercise Goals Re-Evaluation : Exercise Goals Re-Evaluation    Row Name 07/08/19 1613 07/15/19 1551           Exercise Goal Re-Evaluation   Exercise Goals  Review  Increase Physical Activity;Able to understand and use rate of perceived exertion (RPE) scale  Increase Physical Activity;Able to understand and use rate of perceived exertion (RPE) scale      Comments  Patient able to understand and use RPE scale appropriately. Pt c/o shoulder discomrfort on the recumbent stepper, patient exercised using arms 2 minutes and not using arms for 2 minutes.  Patient is do some walking "up and down halls" at home. Pt's goals is to "feel better". Encouraged to increase walking at home as able.      Expected Outcomes  Progress workloads as tolerated to help increase energy and stamina.  Continue to increase workloads at cardiac rehab as tolerated to help build stamina.          Discharge Exercise Prescription (Final Exercise Prescription Changes): Exercise Prescription Changes - 07/13/19 1529      Response to Exercise   Blood Pressure (Admit)  118/70    Blood Pressure (Exercise)  144/74    Blood Pressure (Exit)  92/62   110/74 recheck BP   Heart Rate (Admit)  97 bpm    Heart Rate (Exercise)  111 bpm    Heart Rate (Exit)  99 bpm    Rating of Perceived Exertion (Exercise)  11    Symptoms  none    Duration  Progress to 30 minutes of  aerobic without signs/symptoms of physical distress    Intensity  THRR unchanged      Progression   Progression  Continue to progress workloads to maintain intensity without signs/symptoms of physical distress.    Average METs  2      Resistance Training   Training Prescription  Yes    Weight  1lb    Reps  10-15    Time  10 Minutes      Interval Training   Interval Training  No      T5 Nustep   Level  2    SPM  75    Minutes  25    METs  2       Nutrition:  Target Goals: Understanding of nutrition  guidelines, daily intake of sodium 1500mg , cholesterol 200mg , calories 30% from fat and 7% or less from saturated fats, daily to have 5 or more servings of fruits and vegetables.  Biometrics: Pre Biometrics -  06/30/19 1525      Pre Biometrics   Height  4' 11.25" (1.505 m)    Weight  81 kg    Waist Circumference  46 inches    Hip Circumference  46.5 inches    Waist to Hip Ratio  0.99 %    BMI (Calculated)  35.76    Triceps Skinfold  33 mm    % Body Fat  49.4 %    Grip Strength  27 kg    Flexibility  14 in    Single Leg Stand  13.5 seconds        Nutrition Therapy Plan and Nutrition Goals:   Nutrition Assessments:   Nutrition Goals Re-Evaluation:   Nutrition Goals Discharge (Final Nutrition Goals Re-Evaluation):   Psychosocial: Target Goals: Acknowledge presence or absence of significant depression and/or stress, maximize coping skills, provide positive support system. Participant is able to verbalize types and ability to use techniques and skills needed for reducing stress and depression.  Initial Review & Psychosocial Screening: Initial Psych Review & Screening - 06/30/19 1610      Initial Review   Current issues with  Current Stress Concerns    Source of Stress Concerns  Family    Comments  Patient admits to stress secondary to caring for her husband who has dementia. They recently moved to [facility] to be closer to her daughter and have had some stress secondary to moving as well. 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 She continues to maintain a positive attitude.      Family Dynamics   Good Support System?  Yes   children and sister     Barriers   Psychosocial barriers to participate in program  The patient should benefit from training in stress management and relaxation.;Psychosocial barriers identified (see note)      Screening Interventions   Interventions  Encouraged to exercise;To provide support and resources with identified psychosocial needs    Expected Outcomes  Short Term goal: Utilizing psychosocial counselor, staff and physician to assist  with identification of specific Stressors or current issues interfering with healing process. Setting desired goal for each stressor or current issue identified.;Long Term Goal: Stressors or current issues are controlled or eliminated.;Short Term goal: Identification and review with participant of any Quality of Life or Depression concerns found by scoring the questionnaire.;Long Term goal: The participant improves quality of Life and PHQ9 Scores as seen by post scores and/or verbalization of changes       Quality of Life Scores: Quality of Life - 07/01/19 0834      Quality of Life   Select  Quality of Life      Quality of Life Scores   Health/Function Pre  12.86 %    Socioeconomic Pre  22.5 %    Psych/Spiritual Pre  18.07 %    Family Pre  22.5 %    GLOBAL Pre  17.31 %      Scores of 19 and below usually indicate a poorer quality of life in these areas.  A difference of  2-3 points is a clinically meaningful difference.  A difference of 2-3 points in the total score of  the Quality of Life Index has been associated with significant improvement in overall quality of life, self-image, physical symptoms, and general health in studies assessing change in quality of life.  PHQ-9: Recent Review Flowsheet Data    Depression screen [facility] 2/9 06/30/2019   Decreased Interest 0   Down, Depressed, Hopeless 1   PHQ - 2 Score 1     Interpretation of Total Score  Total Score Depression Severity:  1-4 = Minimal depression, 5-9 = Mild depression, 10-14 = Moderate depression, 15-19 = Moderately severe depression, 20-27 = Severe depression   Psychosocial Evaluation and Intervention: Psychosocial Evaluation - 07/10/19 0938      Psychosocial Evaluation & Interventions   Interventions  Encouraged to exercise with the program and follow exercise prescription    Comments  Patient admits to stress related to caring for her husband with dementia as well as managing her own health. She has a very supportive  daughter. She has recently moved to [facility] to be closer to her daughter who can help care for husband. Laureli enjoys spending time with her grandchildren and crotcheting.    Expected Outcomes  Patient will continue to self monitor stressors. She will utilize support system including her family and health care team and maintain a positive attitude. She will reach out to her support system and care team if additional needs arise or if stressors become unmanagable.    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 She is offered counseling but declines. RN CM is unsure of quality of patient's support system however patient denies need for additional resources.       Expected Outcomes  Patient will continue to self assess for increased s/s of depression and stress related symptoms. She will allow family and friends to provide assistance to she and husband when needed. She will ask for assistance with caregiving when needed.       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 She  is offered counseling but declines. RN CM is unsure of quality of patient's support system however patient denies need for additional resources.    Expected Outcomes  Patient will continue to self assess for increased s/s of depression and stress related symptoms. She will allow family and friends to provide assistance to she and husband when needed. She will ask for assistance with caregiving when needed.    Interventions  Encouraged to attend Cardiac Rehabilitation for the exercise;Stress management education  Continue Psychosocial Services   Follow up required by staff       Vocational Rehabilitation: Provide vocational rehab assistance to qualifying candidates.   Vocational Rehab Evaluation & Intervention: Vocational Rehab - 06/30/19 1548      Initial Vocational Rehab Evaluation & Intervention   Assessment shows need for Vocational Rehabilitation  No       Education: Education Goals: Education classes will be provided on a weekly basis, covering required topics. Participant will state understanding/return demonstration of topics presented.  Learning Barriers/Preferences: Learning Barriers/Preferences - 06/30/19 1527      Learning Barriers/Preferences   Learning Barriers  Sight    Learning Preferences  Written Material       Education Topics: Hypertension, Hypertension Reduction -Define heart disease and high blood pressure. Discus how high blood pressure affects the body and ways to reduce high blood pressure.   Exercise and Your Heart -Discuss why it is important to exercise, the FITT principles of exercise, normal and abnormal responses to exercise, and how to exercise safely.   Angina -Discuss definition of angina, causes of angina, treatment of angina, and how to decrease risk of having angina.   Cardiac Medications -Review what the following cardiac medications are used for, how they affect the body, and side effects that may occur when taking the medications.   Medications include Aspirin, Beta blockers, calcium channel blockers, ACE Inhibitors, angiotensin receptor blockers, diuretics, digoxin, and antihyperlipidemics.   Congestive Heart Failure -Discuss the definition of CHF, how to live with CHF, the signs and symptoms of CHF, and how keep track of weight and sodium intake.   Heart Disease and Intimacy -Discus the effect sexual activity has on the heart, how changes occur during intimacy as we age, and safety during sexual activity.   Smoking Cessation / COPD -Discuss different methods to quit smoking, the health benefits of quitting smoking, and the definition of COPD.   Nutrition I: Fats -Discuss the types of cholesterol, what cholesterol does to the heart, and how cholesterol levels can be controlled.   Nutrition II: Labels -Discuss the different components of food labels and how to read food label   Heart Parts/Heart Disease and PAD -Discuss the anatomy of the heart, the pathway of blood circulation through the heart, and these are affected by heart disease.   Stress I: Signs and Symptoms -Discuss the causes of stress, how stress may lead to anxiety and depression, and ways to limit stress.   Stress II: Relaxation -Discuss different types of relaxation techniques to limit stress.   Warning Signs of Stroke / TIA -Discuss definition of a stroke, what the signs and symptoms are of a stroke, and how to identify when someone is having stroke.   Knowledge Questionnaire Score: Knowledge Questionnaire Score - 07/01/19 0834      Knowledge Questionnaire Score   Pre Score  21/24       Core Components/Risk Factors/Patient Goals at Admission: Personal Goals and Risk Factors at Admission - 06/30/19 1526      Core Components/Risk Factors/Patient Goals on Admission    Weight Management  Yes;Obesity;Weight Maintenance;Weight Loss    Intervention  Weight Management: Develop a combined nutrition and exercise program designed to reach  desired caloric intake, while maintaining appropriate intake of nutrient and fiber, sodium and fats, and appropriate energy expenditure required for the weight goal.;Weight Management: Provide education and appropriate resources to help participant work on and attain dietary goals.;Weight Management/Obesity: Establish reasonable short term and long term weight goals.;Obesity: Provide  education and appropriate resources to help participant work on and attain dietary goals.    Admit Weight  178 lb 9.2 oz (81 kg)    Expected Outcomes  Short Term: Continue to assess and modify interventions until short term weight is achieved;Long Term: Adherence to nutrition and physical activity/exercise program aimed toward attainment of established weight goal;Weight Maintenance: Understanding of the daily nutrition guidelines, which includes 25-35% calories from fat, 7% or less cal from saturated fats, less than 200mg  cholesterol, less than 1.5gm of sodium, & 5 or more servings of fruits and vegetables daily;Weight Loss: Understanding of general recommendations for a balanced deficit meal plan, which promotes 1-2 lb weight loss per week and includes a negative energy balance of [phone] kcal/d;Understanding recommendations for meals to include 15-35% energy as protein, 25-35% energy from fat, 35-60% energy from carbohydrates, less than 200mg  of dietary cholesterol, 20-35 gm of total fiber daily;Understanding of distribution of calorie intake throughout the day with the consumption of 4-5 meals/snacks    Diabetes  Yes    Intervention  Provide education about signs/symptoms and action to take for hypo/hyperglycemia.;Provide education about proper nutrition, including hydration, and aerobic/resistive exercise prescription along with prescribed medications to achieve blood glucose in normal ranges: Fasting glucose 65-99 mg/dL    Expected Outcomes  Short Term: Participant verbalizes understanding of the signs/symptoms and immediate  care of hyper/hypoglycemia, proper foot care and importance of medication, aerobic/resistive exercise and nutrition plan for blood glucose control.;Long Term: Attainment of HbA1C < 7%.    Hypertension  Yes    Intervention  Provide education on lifestyle modifcations including regular physical activity/exercise, weight management, moderate sodium restriction and increased consumption of fresh fruit, vegetables, and low fat dairy, alcohol moderation, and smoking cessation.;Monitor prescription use compliance.    Expected Outcomes  Short Term: Continued assessment and intervention until BP is < 140/74mm HG in hypertensive participants. < 130/67mm HG in hypertensive participants with diabetes, heart failure or chronic kidney disease.;Long Term: Maintenance of blood pressure at goal levels.    Lipids  Yes    Intervention  Provide education and support for participant on nutrition & aerobic/resistive exercise along with prescribed medications to achieve LDL 70mg , HDL >40mg .    Expected Outcomes  Short Term: Participant states understanding of desired cholesterol values and is compliant with medications prescribed. Participant is following exercise prescription and nutrition guidelines.;Long Term: Cholesterol controlled with medications as prescribed, with individualized exercise RX and with personalized nutrition plan. Value goals: LDL < 70mg , HDL > 40 mg.    Stress  Yes    Intervention  Offer individual and/or small group education and counseling on adjustment to heart disease, stress management and health-related lifestyle change. Teach and support self-help strategies.;Refer participants experiencing significant psychosocial distress to appropriate mental health specialists for further evaluation and treatment. When possible, include family members and significant others in education/counseling sessions.    Expected Outcomes  Short Term: Participant demonstrates changes in health-related behavior, relaxation  and other stress management skills, ability to obtain effective social support, and compliance with psychotropic medications if prescribed.;Long Term: Emotional wellbeing is indicated by absence of clinically significant psychosocial distress or social isolation.       Core Components/Risk Factors/Patient Goals Review:  Goals and Risk Factor Review    Row Name 07/08/19 1615             Core Components/Risk Factors/Patient Goals Review   Personal Goals Review  Weight Management/Obesity;Lipids;Diabetes;Stress;Hypertension       Review  Patient with  multiple CAD risk factors including uncontrolled DM. She is eager to participate in CR and looking forward to learning how to manage her risk factors through lifestyle modifications.       Expected Outcomes  Patient will continue to participate in CR to modify risk factors. Her personal goals are to increase her energy and stamina through exercise.          Core Components/Risk Factors/Patient Goals at Discharge (Final Review):  Goals and Risk Factor Review - 07/08/19 1615      Core Components/Risk Factors/Patient Goals Review   Personal Goals Review  Weight Management/Obesity;Lipids;Diabetes;Stress;Hypertension    Review  Patient with multiple CAD risk factors including uncontrolled DM. She is eager to participate in CR and looking forward to learning how to manage her risk factors through lifestyle modifications.    Expected Outcomes  Patient will continue to participate in CR to modify risk factors. Her personal goals are to increase her energy and stamina through exercise.       ITP Comments: ITP Comments    Row Name 06/30/19 1512 07/08/19 1614 07/15/19 1648       ITP Comments  Dr. Fransico Him Cardiac Rehab Medical Director  Patient completed her first day of cardiac rehab exercise today and tolerated well  30 day ITP review: Moniqua has successfully completed 3 exercise session in CR without adverse events related to her blood sugars.  She has had 2 exercise sessions that she was not able to participate in secondary to hypoglycemia. DM medications have been decreased per her PCP which has stabalized her blood sugars during exercise. VSS. Patient has upper body and back pain which is a barrier to workload increases.        Comments: See ITP comment

## 2019-07-17 ENCOUNTER — Encounter (HOSPITAL_COMMUNITY)
Admission: RE | Admit: 2019-07-17 | Discharge: 2019-07-17 | Disposition: A | Discharge status: Home / Self Care | Payer: Medicare Other | Source: Ambulatory Visit | Attending: Cardiology | Admitting: Cardiology

## 2019-07-17 ENCOUNTER — Other Ambulatory Visit: Payer: Self-pay

## 2019-07-17 ENCOUNTER — Telehealth (HOSPITAL_COMMUNITY): Payer: Self-pay

## 2019-07-17 ENCOUNTER — Ambulatory Visit (HOSPITAL_COMMUNITY): Payer: Medicare Other

## 2019-07-17 DIAGNOSIS — Z48812 Encounter for surgical aftercare following surgery on the circulatory system: Secondary | ICD-10-CM | POA: Diagnosis not present

## 2019-07-17 DIAGNOSIS — Z951 Presence of aortocoronary bypass graft: Secondary | ICD-10-CM

## 2019-07-20 ENCOUNTER — Other Ambulatory Visit: Payer: Self-pay

## 2019-07-20 ENCOUNTER — Encounter (HOSPITAL_COMMUNITY)
Admission: RE | Admit: 2019-07-20 | Discharge: 2019-07-20 | Disposition: A | Discharge status: Home / Self Care | Payer: Medicare Other | Source: Ambulatory Visit | Attending: Cardiology | Admitting: Cardiology

## 2019-07-20 ENCOUNTER — Ambulatory Visit (HOSPITAL_COMMUNITY): Payer: Medicare Other

## 2019-07-20 DIAGNOSIS — Z48812 Encounter for surgical aftercare following surgery on the circulatory system: Secondary | ICD-10-CM | POA: Diagnosis not present

## 2019-07-20 DIAGNOSIS — Z951 Presence of aortocoronary bypass graft: Secondary | ICD-10-CM

## 2019-07-21 NOTE — Progress Notes (Signed)
Successful telephone encounter to Jac Canavan, assistant to patients PCP Dr. Lindell Noe, in response to left message from PCP questioning CR hypoglycemic protocol. According to Jac Canavan, patient called PCP to report low CBGs during cardiac rehab exercise and stated that CR staff instructed patient to "eat more carbs". Jac Canavan provided exercise protocol for diabetics verbally and via fax. Goal of collaboration is for continuity of care and education reinforcement. Angellina Ferdinand E. Rollene Rotunda, RN BSN

## 2019-07-21 NOTE — Progress Notes (Addendum)
Pt recently attending cardiac rehab with low blood sugars. She has been eating protein/fat snacks absent of carbohydrates prior to exercise. Pt verbalizes she is confused/overwhelmed by all diet information she has received. Discussed how body utilizes glucose during exercise and insulin MOA. Discussed body response to stress and how that may affect blood sugars. Discussed need for pairing carbs and protein within the hour before exercise to prevent lows. Provided handout of snack ideas. Pt identified the snacks she enjoys and feels will be easy to prepare prior to rehab. Will continue to monitor pt at rehab and provide further education on recommended carb intake at each meal and snack for diabetes management. Pt verbalized understanding.  Michaele Offer, MS, RDN, LDN

## 2019-07-22 ENCOUNTER — Other Ambulatory Visit: Payer: Self-pay

## 2019-07-22 ENCOUNTER — Encounter (HOSPITAL_COMMUNITY)
Admission: RE | Admit: 2019-07-22 | Discharge: 2019-07-22 | Disposition: A | Discharge status: Home / Self Care | Payer: Medicare Other | Source: Ambulatory Visit | Attending: Cardiology | Admitting: Cardiology

## 2019-07-22 ENCOUNTER — Ambulatory Visit (HOSPITAL_COMMUNITY): Payer: Medicare Other

## 2019-07-22 DIAGNOSIS — Z48812 Encounter for surgical aftercare following surgery on the circulatory system: Secondary | ICD-10-CM | POA: Diagnosis not present

## 2019-07-22 DIAGNOSIS — Z951 Presence of aortocoronary bypass graft: Secondary | ICD-10-CM

## 2019-07-22 NOTE — Telephone Encounter (Signed)
I spoke with pt. She reports Dr Lindell Noe at [facility] is her primary care doctor.  Will update chart and send results to Dr Lindell Noe.

## 2019-07-24 ENCOUNTER — Ambulatory Visit (HOSPITAL_COMMUNITY): Payer: Medicare Other

## 2019-07-24 ENCOUNTER — Encounter (HOSPITAL_COMMUNITY)
Admission: RE | Admit: 2019-07-24 | Discharge: 2019-07-24 | Disposition: A | Discharge status: Home / Self Care | Payer: Medicare Other | Source: Ambulatory Visit | Attending: Cardiology | Admitting: Cardiology

## 2019-07-24 ENCOUNTER — Other Ambulatory Visit: Payer: Self-pay

## 2019-07-24 DIAGNOSIS — Z48812 Encounter for surgical aftercare following surgery on the circulatory system: Secondary | ICD-10-CM | POA: Diagnosis not present

## 2019-07-24 DIAGNOSIS — Z951 Presence of aortocoronary bypass graft: Secondary | ICD-10-CM

## 2019-07-27 ENCOUNTER — Encounter (HOSPITAL_COMMUNITY): Payer: Medicare Other

## 2019-07-27 ENCOUNTER — Ambulatory Visit (HOSPITAL_COMMUNITY): Payer: Medicare Other

## 2019-07-27 ENCOUNTER — Telehealth (HOSPITAL_COMMUNITY): Payer: Self-pay | Admitting: *Deleted

## 2019-07-27 NOTE — Telephone Encounter (Signed)
-----   Message from Sueanne Margarita, MD sent at 07/25/2019  2:56 PM EDT ----- Regarding: RE: Target heart rate increase request Ok to increase THR to 40-90% of age predicted max HR  Traci Turner ----- Message ----- From: Sol Passer Sent: 07/24/2019   4:48 PM EDT To: Sueanne Margarita, MD Subject: Target heart rate increase request             Greetings Dr. Radford Pax,   Patient has been in rehab approximately 4 weeks and is doing well.  Blood pressure is within normal limits, but exercise heart rates are beginning to exceed Target Heart Rate Range(THRR) of 60-120 bpm (40%-80% of age predicted max HR).  If no GXT is planned for the near future and MD agrees, request to increase THR to 40% -90% of age predicted max HR 135 bpm.   Thank you, Sol Passer, MS, ACSM CEP

## 2019-07-27 NOTE — Telephone Encounter (Signed)
Pt called and stated that her husband is not feeling well and that she will not be in cardiac rehab on today 07/27/2019 and she should be back on Wednesday 07/29/2019. Canceled pt appointment for 07/27/2019.

## 2019-07-29 ENCOUNTER — Encounter (HOSPITAL_COMMUNITY)
Admission: RE | Admit: 2019-07-29 | Discharge: 2019-07-29 | Disposition: A | Discharge status: Home / Self Care | Payer: Medicare Other | Source: Ambulatory Visit | Attending: Cardiology | Admitting: Cardiology

## 2019-07-29 ENCOUNTER — Other Ambulatory Visit: Payer: Self-pay

## 2019-07-29 ENCOUNTER — Ambulatory Visit (HOSPITAL_COMMUNITY): Payer: Medicare Other

## 2019-07-29 DIAGNOSIS — Z951 Presence of aortocoronary bypass graft: Secondary | ICD-10-CM

## 2019-07-29 DIAGNOSIS — Z48812 Encounter for surgical aftercare following surgery on the circulatory system: Secondary | ICD-10-CM | POA: Diagnosis not present

## 2019-07-29 NOTE — Progress Notes (Signed)
   07/29/19 1559  Exercise Goal Re-Evaluation  Exercise Goals Review Increase Physical Activity;Able to understand and use rate of perceived exertion (RPE) scale;Understanding of Exercise Prescription;Increase Strength and Stamina;Knowledge and understanding of Target Heart Rate Range (THRR);Able to check pulse independently  Comments Reviewed home exercise guidelines with patient including THRR, RPE scale and endpoints for exercise. Pt states she's walking in the house every other day, 10 laps of 30 feet. Walking is limited by chronic back pain. Reviewed pulse coutning, and pt states she knows how to count her pulse but has difficulty finding it. Clearance received from Dr. Radford Pax to increase THRR to 60-135.  Expected Outcomes Continue to increase workloads at cardiac rehab as tolerated to help build stamina.

## 2019-07-31 ENCOUNTER — Other Ambulatory Visit: Payer: Self-pay

## 2019-07-31 ENCOUNTER — Ambulatory Visit (HOSPITAL_COMMUNITY): Payer: Medicare Other

## 2019-07-31 ENCOUNTER — Encounter (HOSPITAL_COMMUNITY)
Admission: RE | Admit: 2019-07-31 | Discharge: 2019-07-31 | Disposition: A | Discharge status: Home / Self Care | Payer: Medicare Other | Source: Ambulatory Visit | Attending: Cardiology | Admitting: Cardiology

## 2019-07-31 DIAGNOSIS — Z951 Presence of aortocoronary bypass graft: Secondary | ICD-10-CM

## 2019-07-31 DIAGNOSIS — Z48812 Encounter for surgical aftercare following surgery on the circulatory system: Secondary | ICD-10-CM | POA: Diagnosis not present

## 2019-08-03 ENCOUNTER — Encounter (HOSPITAL_COMMUNITY)
Admission: RE | Admit: 2019-08-03 | Discharge: 2019-08-03 | Disposition: A | Discharge status: Home / Self Care | Payer: Medicare Other | Source: Ambulatory Visit | Attending: Cardiology | Admitting: Cardiology

## 2019-08-03 ENCOUNTER — Other Ambulatory Visit: Payer: Self-pay

## 2019-08-03 ENCOUNTER — Ambulatory Visit (HOSPITAL_COMMUNITY): Payer: Medicare Other

## 2019-08-03 DIAGNOSIS — Z7982 Long term (current) use of aspirin: Secondary | ICD-10-CM | POA: Diagnosis not present

## 2019-08-03 DIAGNOSIS — I2510 Atherosclerotic heart disease of native coronary artery without angina pectoris: Secondary | ICD-10-CM | POA: Insufficient documentation

## 2019-08-03 DIAGNOSIS — E785 Hyperlipidemia, unspecified: Secondary | ICD-10-CM | POA: Insufficient documentation

## 2019-08-03 DIAGNOSIS — I10 Essential (primary) hypertension: Secondary | ICD-10-CM | POA: Diagnosis not present

## 2019-08-03 DIAGNOSIS — Z48812 Encounter for surgical aftercare following surgery on the circulatory system: Secondary | ICD-10-CM | POA: Insufficient documentation

## 2019-08-03 DIAGNOSIS — E119 Type 2 diabetes mellitus without complications: Secondary | ICD-10-CM | POA: Diagnosis not present

## 2019-08-03 DIAGNOSIS — Z7901 Long term (current) use of anticoagulants: Secondary | ICD-10-CM | POA: Diagnosis not present

## 2019-08-03 DIAGNOSIS — Z794 Long term (current) use of insulin: Secondary | ICD-10-CM | POA: Diagnosis not present

## 2019-08-03 DIAGNOSIS — I4891 Unspecified atrial fibrillation: Secondary | ICD-10-CM | POA: Insufficient documentation

## 2019-08-03 DIAGNOSIS — Z951 Presence of aortocoronary bypass graft: Secondary | ICD-10-CM | POA: Diagnosis not present

## 2019-08-03 DIAGNOSIS — Z79899 Other long term (current) drug therapy: Secondary | ICD-10-CM | POA: Diagnosis not present

## 2019-08-05 ENCOUNTER — Encounter (HOSPITAL_COMMUNITY)
Admission: RE | Admit: 2019-08-05 | Discharge: 2019-08-05 | Disposition: A | Discharge status: Home / Self Care | Payer: Medicare Other | Source: Ambulatory Visit | Attending: Cardiology | Admitting: Cardiology

## 2019-08-05 ENCOUNTER — Ambulatory Visit (HOSPITAL_COMMUNITY): Payer: Medicare Other

## 2019-08-05 ENCOUNTER — Other Ambulatory Visit: Payer: Self-pay

## 2019-08-05 VITALS — Ht 59.25 in | Wt 177.9 lb

## 2019-08-05 DIAGNOSIS — Z48812 Encounter for surgical aftercare following surgery on the circulatory system: Secondary | ICD-10-CM | POA: Diagnosis not present

## 2019-08-05 DIAGNOSIS — Z951 Presence of aortocoronary bypass graft: Secondary | ICD-10-CM

## 2019-08-07 ENCOUNTER — Telehealth (HOSPITAL_COMMUNITY): Payer: Self-pay | Admitting: Family Medicine

## 2019-08-07 ENCOUNTER — Ambulatory Visit (HOSPITAL_COMMUNITY): Payer: Medicare Other

## 2019-08-07 ENCOUNTER — Encounter (HOSPITAL_COMMUNITY): Payer: Medicare Other

## 2019-08-10 ENCOUNTER — Encounter (HOSPITAL_COMMUNITY): Payer: Medicare Other

## 2019-08-10 ENCOUNTER — Telehealth (HOSPITAL_COMMUNITY): Payer: Self-pay | Admitting: Family Medicine

## 2019-08-10 ENCOUNTER — Ambulatory Visit (HOSPITAL_COMMUNITY): Payer: Medicare Other

## 2019-08-11 ENCOUNTER — Telehealth (HOSPITAL_COMMUNITY): Payer: Self-pay

## 2019-08-11 NOTE — Telephone Encounter (Signed)
Successful telephone encounter to patient to address complaints of "not feeling well" and lack of attendance in CR x 2 sessions. Patient states her chronic bowel problems are "flaring up". She hopes to be in attendance on Wednesday. Naveh Rickles E. Laray Anger, BSN

## 2019-08-12 ENCOUNTER — Encounter (HOSPITAL_COMMUNITY)
Admission: RE | Admit: 2019-08-12 | Discharge: 2019-08-12 | Disposition: A | Discharge status: Home / Self Care | Payer: Medicare Other | Source: Ambulatory Visit | Attending: Cardiology | Admitting: Cardiology

## 2019-08-12 ENCOUNTER — Ambulatory Visit (HOSPITAL_COMMUNITY): Payer: Medicare Other

## 2019-08-12 ENCOUNTER — Other Ambulatory Visit: Payer: Self-pay

## 2019-08-12 DIAGNOSIS — Z951 Presence of aortocoronary bypass graft: Secondary | ICD-10-CM

## 2019-08-12 DIAGNOSIS — Z48812 Encounter for surgical aftercare following surgery on the circulatory system: Secondary | ICD-10-CM | POA: Diagnosis not present

## 2019-08-13 NOTE — Progress Notes (Signed)
Cardiac Individual Treatment Plan  Patient Details  Name: Jenny Peters MRN: [id number] Date of Birth: May 29, 1946 Referring Provider:     CARDIAC REHAB PHASE II ORIENTATION from 06/30/2019 in [facility]  Referring Provider  Dr. Radford Pax      Initial Encounter Date:    CARDIAC REHAB PHASE II ORIENTATION from 06/30/2019 in [facility]  Date  06/30/19      Visit Diagnosis: S/P CABG x 3  Patient's Home Medications on Admission:  Current Outpatient Medications:  .  apixaban (ELIQUIS) 5 MG TABS tablet, Take 5 mg by mouth 2 (two) times daily., Disp: , Rfl:  .  Ascorbic Acid (VITAMIN C) 1000 MG tablet, Take 1,000 mg by mouth daily., Disp: , Rfl:  .  aspirin 81 MG chewable tablet, Chew 81 mg by mouth daily., Disp: , Rfl:  .  atorvastatin (LIPITOR) 80 MG tablet, Take 80 mg by mouth daily., Disp: , Rfl:  .  Biotin 1 MG CAPS, Take 1,000 mcg by mouth daily., Disp: , Rfl:  .  CAPSICUM, CAYENNE, PO, Take 1 capsule by mouth daily., Disp: , Rfl:  .  cholecalciferol (VITAMIN D3) 25 MCG (1000 UT) tablet, Take 1,000 Units by mouth daily., Disp: , Rfl:  .  Cinnamon Bark POWD, Take 500 mg by mouth daily., Disp: , Rfl:  .  furosemide (LASIX) 40 MG tablet, Take 40 mg by mouth 2 (two) times daily., Disp: , Rfl:  .  insulin aspart (NOVOLOG) 100 UNIT/ML injection, Inject 2-9 Units into the skin 3 (three) times daily before meals. Sliding scale - usually uses 2-3 units TID, Disp: , Rfl:  .  insulin glargine (LANTUS) 100 UNIT/ML injection, Inject 30 Units into the skin daily., Disp: , Rfl:  .  lisinopril (ZESTRIL) 10 MG tablet, Take 10 mg by mouth daily., Disp: , Rfl:  .  metoprolol succinate (TOPROL-XL) 50 MG 24 hr tablet, Take 50 mg by mouth every morning. Take with or immediately following a meal., Disp: , Rfl:   Past Medical History: Past Medical History:  Diagnosis Date  . Arrhythmia 01/06/2019   Afib  . Cancer ([place])   . Coronary artery  disease   . Diabetes mellitus without complication ([place])   . Hyperlipidemia   . Hypertension   . Ovarian cancer ([place]) 07/2013    Tobacco Use: Social History   Tobacco Use  Smoking Status Never Smoker  Smokeless Tobacco Never Used    Labs: Recent Review Flowsheet Data    There is no flowsheet data to display.      Capillary Blood Glucose: Lab Results  Component Value Date   GLUCAP 130 (H) 07/13/2019   GLUCAP 122 (H) 07/13/2019   GLUCAP 88 07/10/2019   GLUCAP 96 07/08/2019   GLUCAP 119 (H) 07/08/2019     Exercise Target Goals: Exercise Program Goal: Individual exercise prescription set using results from initial 6 min walk test and THRR while considering  patient's activity barriers and safety.   Exercise Prescription Goal: Starting with aerobic activity 30 plus minutes a day, 3 days per week for initial exercise prescription. Provide home exercise prescription and guidelines that participant acknowledges understanding prior to discharge.  Activity Barriers & Risk Stratification: Activity Barriers & Cardiac Risk Stratification - 06/30/19 1523      Activity Barriers & Cardiac Risk Stratification   Activity Barriers  Back Problems;Joint Problems;Balance Concerns;Deconditioning;Muscular Weakness;Other (comment)    Comments  Right rotator cuff repair.    Cardiac Risk  Stratification  High       6 Minute Walk: 6 Minute Walk    Row Name 06/30/19 1520         6 Minute Walk   Phase  Initial     Distance  600 feet     Walk Time  4.3 minutes Discontinued at 4:18.     # of Rest Breaks  0     MPH  1.5     METS  1.7     RPE  13     Perceived Dyspnea   0     VO2 Peak  6.2     Symptoms  Yes (comment)     Comments  5/10 back pain     Resting HR  81 bpm     Resting BP  108/62     Resting Oxygen Saturation   97 %     Exercise Oxygen Saturation  during 6 min walk  100 %     Max Ex. HR  151 bpm     Max Ex. BP  144/80     2 Minute Post BP  110/66        Oxygen  Initial Assessment:   Oxygen Re-Evaluation:   Oxygen Discharge (Final Oxygen Re-Evaluation):   Initial Exercise Prescription: Initial Exercise Prescription - 06/30/19 1500      Date of Initial Exercise RX and Referring Provider   Date  06/30/19    Referring Provider  Dr. Radford Pax    Expected Discharge Date  08/28/19      NuStep   Level  2    SPM  75    Minutes  30    METs  1.5      Prescription Details   Frequency (times per week)  3    Duration  Progress to 30 minutes of continuous aerobic without signs/symptoms of physical distress      Intensity   THRR 40-80% of Max Heartrate  60-120    Ratings of Perceived Exertion  11-13      Progression   Progression  Continue to progress workloads to maintain intensity without signs/symptoms of physical distress.      Resistance Training   Training Prescription  Yes    Weight  1 lb.    Reps  10-15       Perform Capillary Blood Glucose checks as needed.  Exercise Prescription Changes: Exercise Prescription Changes    Row Name 07/08/19 1525 07/13/19 1529 07/24/19 1522 08/12/19 1521       Response to Exercise   Blood Pressure (Admit)  96/60  118/70  134/72  126/78    Blood Pressure (Exercise)  146/80  144/74  138/78  142/82    Blood Pressure (Exit)  104/66  92/62 110/74 recheck BP  118/80  100/72    Heart Rate (Admit)  98 bpm  97 bpm  97 bpm  95 bpm    Heart Rate (Exercise)  106 bpm  111 bpm  131 bpm  117 bpm    Heart Rate (Exit)  89 bpm  99 bpm  106 bpm  92 bpm    Rating of Perceived Exertion (Exercise)  '12  11  11  12    '$ Symptoms  Patient c/o shoulder discomfort.  none  none  none    Comments  Patient tolerated low intensity exercise fair with some shoulder discomfort.  -  -  -    Duration  Progress to 30 minutes of  aerobic without  signs/symptoms of physical distress  Progress to 30 minutes of  aerobic without signs/symptoms of physical distress  Progress to 30 minutes of  aerobic without signs/symptoms of physical  distress  Progress to 30 minutes of  aerobic without signs/symptoms of physical distress    Intensity  THRR unchanged  THRR unchanged  THRR unchanged  THRR New 60-135      Progression   Progression  Continue to progress workloads to maintain intensity without signs/symptoms of physical distress.  Continue to progress workloads to maintain intensity without signs/symptoms of physical distress.  Continue to progress workloads to maintain intensity without signs/symptoms of physical distress.  Continue to progress workloads to maintain intensity without signs/symptoms of physical distress.    Average METs  1.8  2  2.3  2.8      Resistance Training   Training Prescription  No Relaxation day, no weights.  Yes  Yes  No Relaxation day, no weights.    Weight  -  1lb  2lbs  -    Reps  -  10-15  10-15  -    Time  -  10 Minutes  10 Minutes  -      Interval Training   Interval Training  No  No  No  No      NuStep   Level  -  -  -  -    SPM  -  -  -  -    Minutes  -  -  -  -    METs  -  -  -  -      T5 Nustep   Level  '2  2  2  2 '$ Patient increased load to level 3.0 the last 5 minutes.    SPM  75  75  85  85    Minutes  '30  25  30  30    '$ METs  1.8  2  2.3  2.8      Home Exercise Plan   Plans to continue exercise at  -  -  -  Home (comment) Walking    Frequency  -  -  -  Add 3 additional days to program exercise sessions.    Initial Home Exercises Provided  -  -  -  07/29/19       Exercise Comments: Exercise Comments    Row Name 07/08/19 1613 07/15/19 1551 07/24/19 1656 07/29/19 1559 08/12/19 1600   Exercise Comments  Patient tolerated low intensity fair with some shoulder discomfort.  Reviewed METs and goals with patient.  Request to increase exercise target heart rate range sent to Dr. Radford Pax.  Reviewed home exercise guidelines, METs, and goals with patient.  Reviewed METs and goals with patient.      Exercise Goals and Review: Exercise Goals    Row Name 06/30/19 1525              Exercise Goals   Increase Physical Activity  Yes       Intervention  Provide advice, education, support and counseling about physical activity/exercise needs.;Develop an individualized exercise prescription for aerobic and resistive training based on initial evaluation findings, risk stratification, comorbidities and participant's personal goals.       Expected Outcomes  Short Term: Attend rehab on a regular basis to increase amount of physical activity.;Long Term: Add in home exercise to make exercise part of routine and to increase amount of physical activity.;Long Term: Exercising regularly at least 3-5 days a week.  Increase Strength and Stamina  Yes       Intervention  Provide advice, education, support and counseling about physical activity/exercise needs.;Develop an individualized exercise prescription for aerobic and resistive training based on initial evaluation findings, risk stratification, comorbidities and participant's personal goals.       Expected Outcomes  Short Term: Increase workloads from initial exercise prescription for resistance, speed, and METs.;Short Term: Perform resistance training exercises routinely during rehab and add in resistance training at home;Long Term: Improve cardiorespiratory fitness, muscular endurance and strength as measured by increased METs and functional capacity (6MWT)       Able to understand and use rate of perceived exertion (RPE) scale  Yes       Intervention  Provide education and explanation on how to use RPE scale       Expected Outcomes  Short Term: Able to use RPE daily in rehab to express subjective intensity level;Long Term:  Able to use RPE to guide intensity level when exercising independently       Knowledge and understanding of Target Heart Rate Range (THRR)  Yes       Intervention  Provide education and explanation of THRR including how the numbers were predicted and where they are located for reference       Expected Outcomes  Short  Term: Able to state/look up THRR;Long Term: Able to use THRR to govern intensity when exercising independently;Short Term: Able to use daily as guideline for intensity in rehab       Able to check pulse independently  Yes       Intervention  Provide education and demonstration on how to check pulse in carotid and radial arteries.;Review the importance of being able to check your own pulse for safety during independent exercise       Expected Outcomes  Short Term: Able to explain why pulse checking is important during independent exercise;Long Term: Able to check pulse independently and accurately       Understanding of Exercise Prescription  Yes       Intervention  Provide education, explanation, and written materials on patient's individual exercise prescription       Expected Outcomes  Short Term: Able to explain program exercise prescription;Long Term: Able to explain home exercise prescription to exercise independently          Exercise Goals Re-Evaluation : Exercise Goals Re-Evaluation    Row Name 07/08/19 1613 07/15/19 1551 07/29/19 1559 08/12/19 1600       Exercise Goal Re-Evaluation   Exercise Goals Review  Increase Physical Activity;Able to understand and use rate of perceived exertion (RPE) scale  Increase Physical Activity;Able to understand and use rate of perceived exertion (RPE) scale  Increase Physical Activity;Able to understand and use rate of perceived exertion (RPE) scale;Understanding of Exercise Prescription;Increase Strength and Stamina;Knowledge and understanding of Target Heart Rate Range (THRR);Able to check pulse independently  Increase Physical Activity;Able to understand and use rate of perceived exertion (RPE) scale;Understanding of Exercise Prescription;Increase Strength and Stamina;Knowledge and understanding of Target Heart Rate Range (THRR);Able to check pulse independently    Comments  Patient able to understand and use RPE scale appropriately. Pt c/o shoulder  discomrfort on the recumbent stepper, patient exercised using arms 2 minutes and not using arms for 2 minutes.  Patient is do some walking "up and down halls" at home. Pt's goals is to "feel better". Encouraged to increase walking at home as able.  Reviewed home exercise guidelines with patient including THRR, RPE  scale and endpoints for exercise. Pt states she's walking in the house every other day, 10 laps of 30 feet. Walking is limited by chronic back pain. Reviewed pulse coutning, and pt states she knows how to count her pulse but has difficulty finding it. Clearance received from Dr. Radford Pax to increase THRR to 60-135.  Patient's goal is to have energy to do activities. Pt states she is doing better. Received clearance to increase THRR to 60-135, pt aware.    Expected Outcomes  Progress workloads as tolerated to help increase energy and stamina.  Continue to increase workloads at cardiac rehab as tolerated to help build stamina.  Continue to increase workloads at cardiac rehab as tolerated to help build stamina.  Increase workloads and hand weights as tolerated to help increase strength and stamina, so that patient can have energy to activities.        Discharge Exercise Prescription (Final Exercise Prescription Changes): Exercise Prescription Changes - 08/12/19 1521      Response to Exercise   Blood Pressure (Admit)  126/78    Blood Pressure (Exercise)  142/82    Blood Pressure (Exit)  100/72    Heart Rate (Admit)  95 bpm    Heart Rate (Exercise)  117 bpm    Heart Rate (Exit)  92 bpm    Rating of Perceived Exertion (Exercise)  12    Symptoms  none    Duration  Progress to 30 minutes of  aerobic without signs/symptoms of physical distress    Intensity  THRR New   60-135     Progression   Progression  Continue to progress workloads to maintain intensity without signs/symptoms of physical distress.    Average METs  2.8      Resistance Training   Training Prescription  No   Relaxation  day, no weights.     Interval Training   Interval Training  No      T5 Nustep   Level  2   Patient increased load to level 3.0 the last 5 minutes.   SPM  85    Minutes  30    METs  2.8      Home Exercise Plan   Plans to continue exercise at  Home (comment)   Walking   Frequency  Add 3 additional days to program exercise sessions.    Initial Home Exercises Provided  07/29/19       Nutrition:  Target Goals: Understanding of nutrition guidelines, daily intake of sodium '1500mg'$ , cholesterol '200mg'$ , calories 30% from fat and 7% or less from saturated fats, daily to have 5 or more servings of fruits and vegetables.  Biometrics: Pre Biometrics - 06/30/19 1525      Pre Biometrics   Height  4' 11.25" (1.505 m)    Weight  81 kg    Waist Circumference  46 inches    Hip Circumference  46.5 inches    Waist to Hip Ratio  0.99 %    BMI (Calculated)  35.76    Triceps Skinfold  33 mm    % Body Fat  49.4 %    Grip Strength  27 kg    Flexibility  14 in    Single Leg Stand  13.5 seconds        Nutrition Therapy Plan and Nutrition Goals: Nutrition Therapy & Goals - 08/11/19 1139      Nutrition Therapy   Diet  Carb modified/heart healthy    Drug/Food Interactions  Statins/Certain Fruits  Personal Nutrition Goals   Nutrition Goal  Incorporate 15-20 grams protein with every meal and snack to maintain blood sugar control.      Intervention Plan   Intervention  Prescribe, educate and counsel regarding individualized specific dietary modifications aiming towards targeted core components such as weight, hypertension, lipid management, diabetes, heart failure and other comorbidities.;Nutrition handout(s) given to patient.    Expected Outcomes  Short Term Goal: Understand basic principles of dietary content, such as calories, fat, sodium, cholesterol and nutrients.;Long Term Goal: Adherence to prescribed nutrition plan.;Short Term Goal: A plan has been developed with personal nutrition  goals set during dietitian appointment.       Nutrition Assessments:   Nutrition Goals Re-Evaluation: Nutrition Goals Re-Evaluation    [place] Name 08/11/19 1141             Goals   Current Weight  177 lb 14.6 oz (80.7 kg)       Nutrition Goal  Incorporate 15-20 grams protein with every meal and snack to maintain blood sugar control.       Expected Outcome  Follow snack guidelines provided to patient          Nutrition Goals Discharge (Final Nutrition Goals Re-Evaluation): Nutrition Goals Re-Evaluation - 08/11/19 1141      Goals   Current Weight  177 lb 14.6 oz (80.7 kg)    Nutrition Goal  Incorporate 15-20 grams protein with every meal and snack to maintain blood sugar control.    Expected Outcome  Follow snack guidelines provided to patient       Psychosocial: Target Goals: Acknowledge presence or absence of significant depression and/or stress, maximize coping skills, provide positive support system. Participant is able to verbalize types and ability to use techniques and skills needed for reducing stress and depression.  Initial Review & Psychosocial Screening: Initial Psych Review & Screening - 06/30/19 1610      Initial Review   Current issues with  Current Stress Concerns    Source of Stress Concerns  Family    Comments  Patient admits to stress secondary to caring for her husband who has dementia. They recently moved to [facility] to be closer to her daughter and have had some stress secondary to moving as well. 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 She continues to maintain a positive attitude.      Family Dynamics   Good Support System?  Yes   children and sister     Barriers   Psychosocial barriers to participate in program  The patient should benefit from training in stress management and relaxation.;Psychosocial barriers identified (see note)       Screening Interventions   Interventions  Encouraged to exercise;To provide support and resources with identified psychosocial needs    Expected Outcomes  Short Term goal: Utilizing psychosocial counselor, staff and physician to assist with identification of specific Stressors or current issues interfering with healing process. Setting desired goal for each stressor or current issue identified.;Long Term Goal: Stressors or current issues are controlled or eliminated.;Short Term goal: Identification and review with participant of any Quality of Life or Depression concerns found by scoring the questionnaire.;Long Term goal: The participant improves quality of Life and PHQ9 Scores as seen by post scores and/or verbalization of changes       Quality of Life Scores: Quality of Life - 07/01/19 5449  Quality of Life   Select  Quality of Life      Quality of Life Scores   Health/Function Pre  12.86 %    Socioeconomic Pre  22.5 %    Psych/Spiritual Pre  18.07 %    Family Pre  22.5 %    GLOBAL Pre  17.31 %      Scores of 19 and below usually indicate a poorer quality of life in these areas.  A difference of  2-3 points is a clinically meaningful difference.  A difference of 2-3 points in the total score of the Quality of Life Index has been associated with significant improvement in overall quality of life, self-image, physical symptoms, and general health in studies assessing change in quality of life.  PHQ-9: Recent Review Flowsheet Data    Depression screen [facility] 2/9 06/30/2019   Decreased Interest 0   Down, Depressed, Hopeless 1   PHQ - 2 Score 1     Interpretation of Total Score  Total Score Depression Severity:  1-4 = Minimal depression, 5-9 = Mild depression, 10-14 = Moderate depression, 15-19 = Moderately severe depression, 20-27 = Severe depression   Psychosocial Evaluation and Intervention: Psychosocial Evaluation - 07/10/19 0938      Psychosocial Evaluation & Interventions    Interventions  Encouraged to exercise with the program and follow exercise prescription    Comments  Patient admits to stress related to caring for her husband with dementia as well as managing her own health. She has a very supportive daughter. She has recently moved to [facility] to be closer to her daughter who can help care for husband. Erlene enjoys spending time with her grandchildren and crotcheting.    Expected Outcomes  Patient will continue to self monitor stressors. She will utilize support system including her family and health care team and maintain a positive attitude. She will reach out to her support system and care team if additional needs arise or if stressors become unmanagable.    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 She is offered counseling but declines. RN CM is unsure of quality of patient's support system however patient denies need for additional resources.  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 She is offered counseling but declines. RN CM is unsure of quality of patient's support system however patient denies need for additional resources.      Expected Outcomes  Patient will continue to self assess for increased s/s of depression and stress related symptoms. She will allow family and friends to provide assistance to she and husband when needed. She will ask for assistance with caregiving when needed.  Patient  will continue to self assess for increased s/s of depression and stress related symptoms. She will allow family and friends to provide assistance to she and husband when needed. She will ask for  assistance with caregiving when needed.      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 She is offered counseling but declines. RN CM is unsure of quality of patient's support system however patient denies need for additional resources.    Expected Outcomes  Patient will continue to self assess for increased s/s of depression and stress related symptoms. She will allow family and friends to provide assistance to she and husband when needed. She will ask for assistance with caregiving when needed.    Interventions  Encouraged to attend Cardiac Rehabilitation for the exercise    Continue Psychosocial Services   Follow up required by staff       Vocational Rehabilitation: Provide vocational rehab assistance to qualifying candidates.   Vocational Rehab Evaluation & Intervention: Vocational Rehab - 06/30/19 1548      Initial Vocational Rehab Evaluation & Intervention   Assessment shows need for Vocational Rehabilitation  No       Education: Education Goals: Education classes will be provided on a weekly basis, covering required topics. Participant will state understanding/return demonstration of topics presented.  Learning Barriers/Preferences: Learning  Barriers/Preferences - 06/30/19 1527      Learning Barriers/Preferences   Learning Barriers  Sight    Learning Preferences  Written Material       Education Topics: Hypertension, Hypertension Reduction -Define heart disease and high blood pressure. Discus how high blood pressure affects the body and ways to reduce high blood pressure.   Exercise and Your Heart -Discuss why it is important to exercise, the FITT principles of exercise, normal and abnormal responses to exercise, and how to exercise safely.   Angina -Discuss definition of angina, causes of angina, treatment of angina, and how to decrease risk of having angina.   Cardiac Medications -Review what the following cardiac medications are used for, how they affect the body, and side effects that may occur when taking the medications.  Medications include Aspirin, Beta blockers, calcium channel blockers, ACE Inhibitors, angiotensin receptor blockers, diuretics, digoxin, and antihyperlipidemics.   Congestive Heart Failure -Discuss the definition of CHF, how to live with CHF, the signs and symptoms of CHF, and how keep track of weight and sodium intake.   Heart Disease and Intimacy -Discus the effect sexual activity has on the heart, how changes occur during intimacy as we age, and safety during sexual activity.   Smoking Cessation / COPD -Discuss different methods to quit smoking, the health benefits of quitting smoking, and the definition of COPD.   Nutrition I: Fats -Discuss the types of cholesterol, what cholesterol does to the heart, and how cholesterol levels can be controlled.   Nutrition II: Labels -Discuss the different components of food labels and how to read food label   Heart Parts/Heart Disease and PAD -Discuss the anatomy of the heart, the pathway of blood circulation through the heart, and these are affected by heart disease.   Stress I: Signs and  Symptoms -Discuss the causes of stress, how stress  may lead to anxiety and depression, and ways to limit stress.   Stress II: Relaxation -Discuss different types of relaxation techniques to limit stress.   Warning Signs of Stroke / TIA -Discuss definition of a stroke, what the signs and symptoms are of a stroke, and how to identify when someone is having stroke.   Knowledge Questionnaire Score: Knowledge Questionnaire Score - 07/01/19 0834      Knowledge Questionnaire Score   Pre Score  21/24       Core Components/Risk Factors/Patient Goals at Admission: Personal Goals and Risk Factors at Admission - 06/30/19 1526      Core Components/Risk Factors/Patient Goals on Admission    Weight Management  Yes;Obesity;Weight Maintenance;Weight Loss    Intervention  Weight Management: Develop a combined nutrition and exercise program designed to reach desired caloric intake, while maintaining appropriate intake of nutrient and fiber, sodium and fats, and appropriate energy expenditure required for the weight goal.;Weight Management: Provide education and appropriate resources to help participant work on and attain dietary goals.;Weight Management/Obesity: Establish reasonable short term and long term weight goals.;Obesity: Provide education and appropriate resources to help participant work on and attain dietary goals.    Admit Weight  178 lb 9.2 oz (81 kg)    Expected Outcomes  Short Term: Continue to assess and modify interventions until short term weight is achieved;Long Term: Adherence to nutrition and physical activity/exercise program aimed toward attainment of established weight goal;Weight Maintenance: Understanding of the daily nutrition guidelines, which includes 25-35% calories from fat, 7% or less cal from saturated fats, less than '200mg'$  cholesterol, less than 1.5gm of sodium, & 5 or more servings of fruits and vegetables daily;Weight Loss: Understanding of general recommendations for a balanced deficit meal plan, which promotes 1-2 lb  weight loss per week and includes a negative energy balance of [phone] kcal/d;Understanding recommendations for meals to include 15-35% energy as protein, 25-35% energy from fat, 35-60% energy from carbohydrates, less than '200mg'$  of dietary cholesterol, 20-35 gm of total fiber daily;Understanding of distribution of calorie intake throughout the day with the consumption of 4-5 meals/snacks    Diabetes  Yes    Intervention  Provide education about signs/symptoms and action to take for hypo/hyperglycemia.;Provide education about proper nutrition, including hydration, and aerobic/resistive exercise prescription along with prescribed medications to achieve blood glucose in normal ranges: Fasting glucose 65-99 mg/dL    Expected Outcomes  Short Term: Participant verbalizes understanding of the signs/symptoms and immediate care of hyper/hypoglycemia, proper foot care and importance of medication, aerobic/resistive exercise and nutrition plan for blood glucose control.;Long Term: Attainment of HbA1C < 7%.    Hypertension  Yes    Intervention  Provide education on lifestyle modifcations including regular physical activity/exercise, weight management, moderate sodium restriction and increased consumption of fresh fruit, vegetables, and low fat dairy, alcohol moderation, and smoking cessation.;Monitor prescription use compliance.    Expected Outcomes  Short Term: Continued assessment and intervention until BP is < 140/24m HG in hypertensive participants. < 130/881mHG in hypertensive participants with diabetes, heart failure or chronic kidney disease.;Long Term: Maintenance of blood pressure at goal levels.    Lipids  Yes    Intervention  Provide education and support for participant on nutrition & aerobic/resistive exercise along with prescribed medications to achieve LDL '70mg'$ , HDL >'40mg'$ .    Expected Outcomes  Short Term: Participant states understanding of desired cholesterol values and is compliant with  medications prescribed. Participant is following  exercise prescription and nutrition guidelines.;Long Term: Cholesterol controlled with medications as prescribed, with individualized exercise RX and with personalized nutrition plan. Value goals: LDL < '70mg'$ , HDL > 40 mg.    Stress  Yes    Intervention  Offer individual and/or small group education and counseling on adjustment to heart disease, stress management and health-related lifestyle change. Teach and support self-help strategies.;Refer participants experiencing significant psychosocial distress to appropriate mental health specialists for further evaluation and treatment. When possible, include family members and significant others in education/counseling sessions.    Expected Outcomes  Short Term: Participant demonstrates changes in health-related behavior, relaxation and other stress management skills, ability to obtain effective social support, and compliance with psychotropic medications if prescribed.;Long Term: Emotional wellbeing is indicated by absence of clinically significant psychosocial distress or social isolation.       Core Components/Risk Factors/Patient Goals Review:  Goals and Risk Factor Review    Row Name 07/08/19 1615             Core Components/Risk Factors/Patient Goals Review   Personal Goals Review  Weight Management/Obesity;Lipids;Diabetes;Stress;Hypertension       Review  Patient with multiple CAD risk factors including uncontrolled DM. She is eager to participate in CR and looking forward to learning how to manage her risk factors through lifestyle modifications.       Expected Outcomes  Patient will continue to participate in CR to modify risk factors. Her personal goals are to increase her energy and stamina through exercise.          Core Components/Risk Factors/Patient Goals at Discharge (Final Review):  Goals and Risk Factor Review - 07/08/19 1615      Core Components/Risk Factors/Patient Goals Review    Personal Goals Review  Weight Management/Obesity;Lipids;Diabetes;Stress;Hypertension    Review  Patient with multiple CAD risk factors including uncontrolled DM. She is eager to participate in CR and looking forward to learning how to manage her risk factors through lifestyle modifications.    Expected Outcomes  Patient will continue to participate in CR to modify risk factors. Her personal goals are to increase her energy and stamina through exercise.       ITP Comments: ITP Comments    Row Name 06/30/19 1512 07/08/19 1614 07/15/19 1648 08/13/19 0811     ITP Comments  Dr. Fransico Him Cardiac Rehab Medical Director  Patient completed her first day of cardiac rehab exercise today and tolerated well  30 day ITP review: Evoleth has successfully completed 3 exercise session in CR without adverse events related to her blood sugars. She has had 2 exercise sessions that she was not able to participate in secondary to hypoglycemia. DM medications have been decreased per her PCP which has stabalized her blood sugars during exercise. VSS. Patient has upper body and back pain which is a barrier to workload increases.  30 day ITP review: Triva has successfully completed 12 exercise session in CR without adverse events related to her blood sugars. She has had 3 exercise sessions that she was not able to participate in secondary to hypoglycemia. DM medications have been decreased per her PCP which has stabalized her blood sugars during exercise. She has met with RD on multiple occasions for nutrition education however this continues to be a struggle for her. Initially she was presenting to exercise with little to no food intake or a high intake of am carbs. This is in the process of resolving as she continues to learn. VSS. Patient has chronic upper body  and back pain which is a barrier to workload increases.       Comments: see ITP comments

## 2019-08-14 ENCOUNTER — Encounter (HOSPITAL_COMMUNITY)
Admission: RE | Admit: 2019-08-14 | Discharge: 2019-08-14 | Disposition: A | Discharge status: Home / Self Care | Payer: Medicare Other | Source: Ambulatory Visit | Attending: Cardiology | Admitting: Cardiology

## 2019-08-14 ENCOUNTER — Ambulatory Visit (HOSPITAL_COMMUNITY): Payer: Medicare Other

## 2019-08-14 ENCOUNTER — Other Ambulatory Visit: Payer: Self-pay

## 2019-08-14 DIAGNOSIS — Z48812 Encounter for surgical aftercare following surgery on the circulatory system: Secondary | ICD-10-CM | POA: Diagnosis not present

## 2019-08-14 DIAGNOSIS — Z951 Presence of aortocoronary bypass graft: Secondary | ICD-10-CM

## 2019-08-17 ENCOUNTER — Telehealth (HOSPITAL_COMMUNITY): Payer: Self-pay | Admitting: Family Medicine

## 2019-08-17 ENCOUNTER — Telehealth (HOSPITAL_COMMUNITY): Payer: Self-pay

## 2019-08-17 ENCOUNTER — Encounter (HOSPITAL_COMMUNITY): Payer: Medicare Other

## 2019-08-17 ENCOUNTER — Ambulatory Visit (HOSPITAL_COMMUNITY): Payer: Medicare Other

## 2019-08-17 NOTE — Telephone Encounter (Signed)
Successful telephone encounter to patient to provide further instructions to not return to CR until husbands Covid-19 test has resulted. Patient verbalized understanding. She will notify CR with results which will determine further absences from cardiac rehab.  [name], BSN

## 2019-08-19 ENCOUNTER — Telehealth (HOSPITAL_COMMUNITY): Payer: Self-pay | Admitting: Family Medicine

## 2019-08-19 ENCOUNTER — Encounter (HOSPITAL_COMMUNITY): Payer: Medicare Other

## 2019-08-19 ENCOUNTER — Ambulatory Visit (HOSPITAL_COMMUNITY): Payer: Medicare Other

## 2019-08-21 ENCOUNTER — Ambulatory Visit (HOSPITAL_COMMUNITY): Payer: Medicare Other

## 2019-08-21 ENCOUNTER — Encounter (HOSPITAL_COMMUNITY): Payer: Medicare Other

## 2019-08-24 ENCOUNTER — Ambulatory Visit (HOSPITAL_COMMUNITY): Payer: Medicare Other

## 2019-08-24 ENCOUNTER — Encounter (HOSPITAL_COMMUNITY)
Admission: RE | Admit: 2019-08-24 | Discharge: 2019-08-24 | Disposition: A | Discharge status: Home / Self Care | Payer: Medicare Other | Source: Ambulatory Visit | Attending: Cardiology | Admitting: Cardiology

## 2019-08-24 ENCOUNTER — Other Ambulatory Visit: Payer: Self-pay

## 2019-08-24 DIAGNOSIS — Z951 Presence of aortocoronary bypass graft: Secondary | ICD-10-CM

## 2019-08-24 DIAGNOSIS — Z48812 Encounter for surgical aftercare following surgery on the circulatory system: Secondary | ICD-10-CM | POA: Diagnosis not present

## 2019-08-26 ENCOUNTER — Encounter (HOSPITAL_COMMUNITY)
Admission: RE | Admit: 2019-08-26 | Discharge: 2019-08-26 | Disposition: A | Discharge status: Home / Self Care | Payer: Medicare Other | Source: Ambulatory Visit | Attending: Cardiology | Admitting: Cardiology

## 2019-08-26 ENCOUNTER — Other Ambulatory Visit: Payer: Self-pay

## 2019-08-26 ENCOUNTER — Ambulatory Visit (HOSPITAL_COMMUNITY): Payer: Medicare Other

## 2019-08-26 DIAGNOSIS — Z951 Presence of aortocoronary bypass graft: Secondary | ICD-10-CM

## 2019-08-26 DIAGNOSIS — Z48812 Encounter for surgical aftercare following surgery on the circulatory system: Secondary | ICD-10-CM | POA: Diagnosis not present

## 2019-08-28 ENCOUNTER — Ambulatory Visit (HOSPITAL_COMMUNITY): Payer: Medicare Other

## 2019-08-28 ENCOUNTER — Encounter (HOSPITAL_COMMUNITY): Payer: Medicare Other

## 2019-08-31 ENCOUNTER — Encounter (HOSPITAL_COMMUNITY)
Admission: RE | Admit: 2019-08-31 | Discharge: 2019-08-31 | Disposition: A | Discharge status: Home / Self Care | Payer: Medicare Other | Source: Ambulatory Visit | Attending: Cardiology | Admitting: Cardiology

## 2019-08-31 ENCOUNTER — Other Ambulatory Visit: Payer: Self-pay

## 2019-08-31 DIAGNOSIS — Z951 Presence of aortocoronary bypass graft: Secondary | ICD-10-CM

## 2019-08-31 DIAGNOSIS — Z48812 Encounter for surgical aftercare following surgery on the circulatory system: Secondary | ICD-10-CM | POA: Diagnosis not present

## 2019-09-02 ENCOUNTER — Other Ambulatory Visit: Payer: Self-pay

## 2019-09-02 ENCOUNTER — Encounter (HOSPITAL_COMMUNITY)
Admission: RE | Admit: 2019-09-02 | Discharge: 2019-09-02 | Disposition: A | Discharge status: Home / Self Care | Payer: Medicare Other | Source: Ambulatory Visit | Attending: Cardiology | Admitting: Cardiology

## 2019-09-02 DIAGNOSIS — Z7982 Long term (current) use of aspirin: Secondary | ICD-10-CM | POA: Insufficient documentation

## 2019-09-02 DIAGNOSIS — I2510 Atherosclerotic heart disease of native coronary artery without angina pectoris: Secondary | ICD-10-CM | POA: Insufficient documentation

## 2019-09-02 DIAGNOSIS — Z7901 Long term (current) use of anticoagulants: Secondary | ICD-10-CM | POA: Diagnosis not present

## 2019-09-02 DIAGNOSIS — E785 Hyperlipidemia, unspecified: Secondary | ICD-10-CM | POA: Diagnosis not present

## 2019-09-02 DIAGNOSIS — I10 Essential (primary) hypertension: Secondary | ICD-10-CM | POA: Insufficient documentation

## 2019-09-02 DIAGNOSIS — Z951 Presence of aortocoronary bypass graft: Secondary | ICD-10-CM | POA: Diagnosis not present

## 2019-09-02 DIAGNOSIS — Z794 Long term (current) use of insulin: Secondary | ICD-10-CM | POA: Diagnosis not present

## 2019-09-02 DIAGNOSIS — Z48812 Encounter for surgical aftercare following surgery on the circulatory system: Secondary | ICD-10-CM | POA: Insufficient documentation

## 2019-09-02 DIAGNOSIS — Z79899 Other long term (current) drug therapy: Secondary | ICD-10-CM | POA: Insufficient documentation

## 2019-09-02 DIAGNOSIS — I4891 Unspecified atrial fibrillation: Secondary | ICD-10-CM | POA: Diagnosis not present

## 2019-09-02 DIAGNOSIS — E119 Type 2 diabetes mellitus without complications: Secondary | ICD-10-CM | POA: Insufficient documentation

## 2019-09-04 ENCOUNTER — Encounter (HOSPITAL_COMMUNITY)
Admission: RE | Admit: 2019-09-04 | Discharge: 2019-09-04 | Disposition: A | Discharge status: Home / Self Care | Payer: Medicare Other | Source: Ambulatory Visit | Attending: Cardiology | Admitting: Cardiology

## 2019-09-04 ENCOUNTER — Other Ambulatory Visit: Payer: Self-pay

## 2019-09-04 VITALS — BP 104/64 | HR 97 | Temp 98.4°F | Ht 59.25 in | Wt 177.0 lb

## 2019-09-04 DIAGNOSIS — Z951 Presence of aortocoronary bypass graft: Secondary | ICD-10-CM

## 2019-09-04 DIAGNOSIS — Z48812 Encounter for surgical aftercare following surgery on the circulatory system: Secondary | ICD-10-CM | POA: Diagnosis not present

## 2019-09-04 NOTE — Progress Notes (Signed)
Discharge Progress Report  Patient Details  Name: Jenny Peters MRN: [id number] Date of Birth: [birth year] Referring Provider:     CARDIAC REHAB PHASE II ORIENTATION from 06/30/2019 in [facility]  Referring Provider  Dr. Radford Pax       Number of Visits: 18  Reason for Discharge:  Patient reached a stable level of exercise. Patient independent in their exercise. Patient has met program and personal goals.  Smoking History:  Social History   Tobacco Use  Smoking Status Never Smoker  Smokeless Tobacco Never Used    Diagnosis:  S/P CABG x 3  ADL [facility]SD:   Initial Exercise Prescription: Initial Exercise Prescription - 06/30/19 1500      Date of Initial Exercise RX and Referring Provider   Date  06/30/19    Referring Provider  Dr. Radford Pax    Expected Discharge Date  08/28/19      NuStep   Level  2    SPM  75    Minutes  30    METs  1.5      Prescription Details   Frequency (times per week)  3    Duration  Progress to 30 minutes of continuous aerobic without signs/symptoms of physical distress      Intensity   THRR 40-80% of Max Heartrate  60-120    Ratings of Perceived Exertion  11-13      Progression   Progression  Continue to progress workloads to maintain intensity without signs/symptoms of physical distress.      Resistance Training   Training Prescription  Yes    Weight  1 lb.    Reps  10-15       Discharge Exercise Prescription (Final Exercise Prescription Changes): Exercise Prescription Changes - 09/04/19 1520      Response to Exercise   Blood Pressure (Admit)  104/64    Blood Pressure (Exercise)  148/80    Blood Pressure (Exit)  102/72    Heart Rate (Admit)  97 bpm    Heart Rate (Exercise)  132 bpm    Heart Rate (Exit)  97 bpm    Rating of Perceived Exertion (Exercise)  13    Symptoms  none    Duration  Progress to 30 minutes of  aerobic without signs/symptoms of physical distress    Intensity  THRR unchanged    60-135     Progression   Progression  Continue to progress workloads to maintain intensity without signs/symptoms of physical distress.    Average METs  4.5      Resistance Training   Training Prescription  No      Interval Training   Interval Training  No      T5 Nustep   Level  2    SPM  85    Minutes  20    METs  4.5      Home Exercise Plan   Plans to continue exercise at  Home (comment)   Walking   Frequency  Add 3 additional days to program exercise sessions.    Initial Home Exercises Provided  07/29/19       Functional Capacity: 6 Minute Walk    Row Name 06/30/19 1520 08/31/19 1529       6 Minute Walk   Phase  Initial  Discharge    Distance  600 feet  899 feet    Distance % Change  -  49.83 %    Distance Feet Change  -  299  ft    Walk Time  4.3 minutes Discontinued at 4:18.  6 minutes Discontinued at 4:18.    # of Rest Breaks  0  0    MPH  1.5  1.7    METS  1.7  1.96    RPE  13  13    Perceived Dyspnea   0  0    VO2 Peak  6.2  6.86    Symptoms  Yes (comment)  Yes (comment)    Comments  5/10 back pain  Patient c/o bilateral hip, which she rated a "2-3/10" on the pain scale.    Resting HR  81 bpm  93 bpm    Resting BP  108/62  98/60    Resting Oxygen Saturation   97 %  -    Exercise Oxygen Saturation  during 6 min walk  100 %  -    Max Ex. HR  151 bpm  138 bpm    Max Ex. BP  144/80  128/82    2 Minute Post BP  110/66  98/70       Psychological, QOL, Others - Outcomes: PHQ 2/9: Depression screen [facility] 2/9 09/04/2019 06/30/2019  Decreased Interest 0 0  Down, Depressed, Hopeless 0 1  PHQ - 2 Score 0 1    Quality of Life: Quality of Life - 08/31/19 1719      Quality of Life   Select  Quality of Life      Quality of Life Scores   Health/Function Pre  12.86 %    Health/Function Post  14 %    Health/Function % Change  8.86 %    Socioeconomic Pre  22.5 %    Socioeconomic Post  23.64 %    Socioeconomic % Change   5.07 %    Psych/Spiritual Pre   18.07 %    Psych/Spiritual Post  16.71 %    Psych/Spiritual % Change  -7.53 %    Family Pre  22.5 %    Family Post  20.4 %    Family % Change  -9.33 %    GLOBAL Pre  17.31 %    GLOBAL Post  17.59 %    GLOBAL % Change  1.62 %       Personal Goals: Goals established at orientation with interventions provided to work toward goal. Personal Goals and Risk Factors at Admission - 06/30/19 1526      Core Components/Risk Factors/Patient Goals on Admission    Weight Management  Yes;Obesity;Weight Maintenance;Weight Loss    Intervention  Weight Management: Develop a combined nutrition and exercise program designed to reach desired caloric intake, while maintaining appropriate intake of nutrient and fiber, sodium and fats, and appropriate energy expenditure required for the weight goal.;Weight Management: Provide education and appropriate resources to help participant work on and attain dietary goals.;Weight Management/Obesity: Establish reasonable short term and long term weight goals.;Obesity: Provide education and appropriate resources to help participant work on and attain dietary goals.    Admit Weight  178 lb 9.2 oz (81 kg)    Expected Outcomes  Short Term: Continue to assess and modify interventions until short term weight is achieved;Long Term: Adherence to nutrition and physical activity/exercise program aimed toward attainment of established weight goal;Weight Maintenance: Understanding of the daily nutrition guidelines, which includes 25-35% calories from fat, 7% or less cal from saturated fats, less than '200mg'$  cholesterol, less than 1.5gm of sodium, & 5 or more servings of fruits and vegetables daily;Weight Loss: Understanding  of general recommendations for a balanced deficit meal plan, which promotes 1-2 lb weight loss per week and includes a negative energy balance of [phone] kcal/d;Understanding recommendations for meals to include 15-35% energy as protein, 25-35% energy from fat, 35-60%  energy from carbohydrates, less than '200mg'$  of dietary cholesterol, 20-35 gm of total fiber daily;Understanding of distribution of calorie intake throughout the day with the consumption of 4-5 meals/snacks    Diabetes  Yes    Intervention  Provide education about signs/symptoms and action to take for hypo/hyperglycemia.;Provide education about proper nutrition, including hydration, and aerobic/resistive exercise prescription along with prescribed medications to achieve blood glucose in normal ranges: Fasting glucose 65-99 mg/dL    Expected Outcomes  Short Term: Participant verbalizes understanding of the signs/symptoms and immediate care of hyper/hypoglycemia, proper foot care and importance of medication, aerobic/resistive exercise and nutrition plan for blood glucose control.;Long Term: Attainment of HbA1C < 7%.    Hypertension  Yes    Intervention  Provide education on lifestyle modifcations including regular physical activity/exercise, weight management, moderate sodium restriction and increased consumption of fresh fruit, vegetables, and low fat dairy, alcohol moderation, and smoking cessation.;Monitor prescription use compliance.    Expected Outcomes  Short Term: Continued assessment and intervention until BP is < 140/35m HG in hypertensive participants. < 130/852mHG in hypertensive participants with diabetes, heart failure or chronic kidney disease.;Long Term: Maintenance of blood pressure at goal levels.    Lipids  Yes    Intervention  Provide education and support for participant on nutrition & aerobic/resistive exercise along with prescribed medications to achieve LDL '70mg'$ , HDL >'40mg'$ .    Expected Outcomes  Short Term: Participant states understanding of desired cholesterol values and is compliant with medications prescribed. Participant is following exercise prescription and nutrition guidelines.;Long Term: Cholesterol controlled with medications as prescribed, with individualized exercise RX  and with personalized nutrition plan. Value goals: LDL < '70mg'$ , HDL > 40 mg.    Stress  Yes    Intervention  Offer individual and/or small group education and counseling on adjustment to heart disease, stress management and health-related lifestyle change. Teach and support self-help strategies.;Refer participants experiencing significant psychosocial distress to appropriate mental health specialists for further evaluation and treatment. When possible, include family members and significant others in education/counseling sessions.    Expected Outcomes  Short Term: Participant demonstrates changes in health-related behavior, relaxation and other stress management skills, ability to obtain effective social support, and compliance with psychotropic medications if prescribed.;Long Term: Emotional wellbeing is indicated by absence of clinically significant psychosocial distress or social isolation.        Personal Goals Discharge: Goals and Risk Factor Review    Row Name 07/08/19 1615 09/07/19 1652           Core Components/Risk Factors/Patient Goals Review   Personal Goals Review  Weight Management/Obesity;Lipids;Diabetes;Stress;Hypertension  Weight Management/Obesity;Lipids;Diabetes;Stress;Hypertension      Review  Patient with multiple CAD risk factors including uncontrolled DM. She is eager to participate in CR and looking forward to learning how to manage her risk factors through lifestyle modifications.  Patient with multiple CAD risk factors including uncontrolled DM. She has benefited from continued counseling from RD while in CR. She also states exercise has helped reduce her caregiver stress as her husband has dementia. She has not lost weight but states her stamina and strength have improved.      Expected Outcomes  Patient will continue to participate in CR to modify risk factors. Her personal goals are to  increase her energy and stamina through exercise.  Patient will continue to exercise at  home and modify her lifestyle inorder to decrease her CAD risk factors.         Exercise Goals and Review: Exercise Goals    Row Name 06/30/19 1525             Exercise Goals   Increase Physical Activity  Yes       Intervention  Provide advice, education, support and counseling about physical activity/exercise needs.;Develop an individualized exercise prescription for aerobic and resistive training based on initial evaluation findings, risk stratification, comorbidities and participant's personal goals.       Expected Outcomes  Short Term: Attend rehab on a regular basis to increase amount of physical activity.;Long Term: Add in home exercise to make exercise part of routine and to increase amount of physical activity.;Long Term: Exercising regularly at least 3-5 days a week.       Increase Strength and Stamina  Yes       Intervention  Provide advice, education, support and counseling about physical activity/exercise needs.;Develop an individualized exercise prescription for aerobic and resistive training based on initial evaluation findings, risk stratification, comorbidities and participant's personal goals.       Expected Outcomes  Short Term: Increase workloads from initial exercise prescription for resistance, speed, and METs.;Short Term: Perform resistance training exercises routinely during rehab and add in resistance training at home;Long Term: Improve cardiorespiratory fitness, muscular endurance and strength as measured by increased METs and functional capacity (6MWT)       Able to understand and use rate of perceived exertion (RPE) scale  Yes       Intervention  Provide education and explanation on how to use RPE scale       Expected Outcomes  Short Term: Able to use RPE daily in rehab to express subjective intensity level;Long Term:  Able to use RPE to guide intensity level when exercising independently       Knowledge and understanding of Target Heart Rate Range (THRR)  Yes        Intervention  Provide education and explanation of THRR including how the numbers were predicted and where they are located for reference       Expected Outcomes  Short Term: Able to state/look up THRR;Long Term: Able to use THRR to govern intensity when exercising independently;Short Term: Able to use daily as guideline for intensity in rehab       Able to check pulse independently  Yes       Intervention  Provide education and demonstration on how to check pulse in carotid and radial arteries.;Review the importance of being able to check your own pulse for safety during independent exercise       Expected Outcomes  Short Term: Able to explain why pulse checking is important during independent exercise;Long Term: Able to check pulse independently and accurately       Understanding of Exercise Prescription  Yes       Intervention  Provide education, explanation, and written materials on patient's individual exercise prescription       Expected Outcomes  Short Term: Able to explain program exercise prescription;Long Term: Able to explain home exercise prescription to exercise independently          Exercise Goals Re-Evaluation: Exercise Goals Re-Evaluation    Row Name 07/08/19 1613 07/15/19 1551 07/29/19 1559 08/12/19 1600 08/31/19 1540     Exercise Goal Re-Evaluation   Exercise Goals Review  Increase Physical Activity;Able to understand and use rate of perceived exertion (RPE) scale  Increase Physical Activity;Able to understand and use rate of perceived exertion (RPE) scale  Increase Physical Activity;Able to understand and use rate of perceived exertion (RPE) scale;Understanding of Exercise Prescription;Increase Strength and Stamina;Knowledge and understanding of Target Heart Rate Range (THRR);Able to check pulse independently  Increase Physical Activity;Able to understand and use rate of perceived exertion (RPE) scale;Understanding of Exercise Prescription;Increase Strength and Stamina;Knowledge  and understanding of Target Heart Rate Range (THRR);Able to check pulse independently  Increase Physical Activity;Able to understand and use rate of perceived exertion (RPE) scale;Understanding of Exercise Prescription;Increase Strength and Stamina;Knowledge and understanding of Target Heart Rate Range (THRR);Able to check pulse independently   Comments  Patient able to understand and use RPE scale appropriately. Pt c/o shoulder discomrfort on the recumbent stepper, patient exercised using arms 2 minutes and not using arms for 2 minutes.  Patient is do some walking "up and down halls" at home. Pt's goals is to "feel better". Encouraged to increase walking at home as able.  Reviewed home exercise guidelines with patient including THRR, RPE scale and endpoints for exercise. Pt states she's walking in the house every other day, 10 laps of 30 feet. Walking is limited by chronic back pain. Reviewed pulse coutning, and pt states she knows how to count her pulse but has difficulty finding it. Clearance received from Dr. Radford Pax to increase THRR to 60-135.  Patient's goal is to have energy to do activities. Pt states she is doing better. Received clearance to increase THRR to 60-135, pt aware.  Patient's functional capacity increased 50% as measured by 6MWT. Patient scheduled to graduate this Friday.   Expected Outcomes  Progress workloads as tolerated to help increase energy and stamina.  Continue to increase workloads at cardiac rehab as tolerated to help build stamina.  Continue to increase workloads at cardiac rehab as tolerated to help build stamina.  Increase workloads and hand weights as tolerated to help increase strength and stamina, so that patient can have energy to activities.  Patient will continue exercise walking at home as tolerated.   [place]by Name 09/04/19 1623             Exercise Goal Re-Evaluation   Exercise Goals Review  Increase Physical Activity;Able to understand and use rate of perceived  exertion (RPE) scale;Understanding of Exercise Prescription;Increase Strength and Stamina;Knowledge and understanding of Target Heart Rate Range (THRR);Able to check pulse independently       Comments  Patient completed the phase 2 cardiac rehab program and progressed well achieving 3-4 METs with exercise. Pt is currently walking up and down the halls of her home throughout the day, and plans to continue that routine. Discussed patient walking at least 10 minutes 3 times/day, and pt is amenable to this.       Expected Outcomes  Patient will try to accumulate 30 minutes of walking daily as tolerated to help maintain health and fitness gains.          Nutrition & Weight - Outcomes: Pre Biometrics - 06/30/19 1525      Pre Biometrics   Height  4' 11.25" (1.505 m)    Weight  81 kg    Waist Circumference  46 inches    Hip Circumference  46.5 inches    Waist to Hip Ratio  0.99 %    BMI (Calculated)  35.76    Triceps Skinfold  33 mm    % Body Fat  49.4 %    Grip Strength  27 kg    Flexibility  14 in    Single Leg Stand  13.5 seconds      Post Biometrics - 09/04/19 1600       Post  Biometrics   Height  4' 11.25" (1.505 m)    Weight  80.3 kg    Waist Circumference  44.5 inches    Hip Circumference  46 inches    Waist to Hip Ratio  0.97 %    BMI (Calculated)  35.45    Grip Strength  25.5 kg    Flexibility  14 in    Single Leg Stand  16.43 seconds       Nutrition: Nutrition Therapy & Goals - 08/11/19 1139      Nutrition Therapy   Diet  Carb modified/heart healthy    Drug/Food Interactions  Statins/Certain Fruits      Personal Nutrition Goals   Nutrition Goal  Incorporate 15-20 grams protein with every meal and snack to maintain blood sugar control.      Intervention Plan   Intervention  Prescribe, educate and counsel regarding individualized specific dietary modifications aiming towards targeted core components such as weight, hypertension, lipid management, diabetes, heart  failure and other comorbidities.;Nutrition handout(s) given to patient.    Expected Outcomes  Short Term Goal: Understand basic principles of dietary content, such as calories, fat, sodium, cholesterol and nutrients.;Long Term Goal: Adherence to prescribed nutrition plan.;Short Term Goal: A plan has been developed with personal nutrition goals set during dietitian appointment.       Nutrition Discharge:   Education Questionnaire Score: Knowledge Questionnaire Score - 08/31/19 1723      Knowledge Questionnaire Score   Pre Score  21/24    Post Score  23/24       Pt graduated from cardiac rehab program today with completion of 18 exercise sessions in Phase II. Pt maintained good attendance and progressed nicely during his participation in rehab as evidenced by increased MET level. Medication list reconciled. Repeat  PHQ score-0. Her quality of life remains low however she feels she does not need a referral for counseling. Her husband is currently hospitalized for CAD and had a CABG today. Plans are for him to go to SNF prior to coming home to continue to allow her time to focus on her own recovery post surgery.  Pt has made significant lifestyle changes and should be commended for his success. Pt feels he has achieved his goals during cardiac rehab.  Goals reviewed with patient; copy given to patient.

## 2020-10-15 ENCOUNTER — Other Ambulatory Visit: Payer: Self-pay | Admitting: Family Medicine

## 2020-10-15 DIAGNOSIS — E2839 Other primary ovarian failure: Secondary | ICD-10-CM

## 2020-10-15 DIAGNOSIS — Z1231 Encounter for screening mammogram for malignant neoplasm of breast: Secondary | ICD-10-CM

## 2021-03-15 ENCOUNTER — Ambulatory Visit
Admission: RE | Admit: 2021-03-15 | Discharge: 2021-03-15 | Disposition: A | Discharge status: Home / Self Care | Payer: Medicare Other | Source: Ambulatory Visit | Attending: Family Medicine | Admitting: Family Medicine

## 2021-03-15 ENCOUNTER — Other Ambulatory Visit: Payer: Self-pay

## 2021-03-15 DIAGNOSIS — Z1231 Encounter for screening mammogram for malignant neoplasm of breast: Secondary | ICD-10-CM

## 2021-03-15 DIAGNOSIS — E2839 Other primary ovarian failure: Secondary | ICD-10-CM

## 2021-03-20 ENCOUNTER — Other Ambulatory Visit: Payer: Self-pay | Admitting: Family Medicine

## 2021-03-20 DIAGNOSIS — R928 Other abnormal and inconclusive findings on diagnostic imaging of breast: Secondary | ICD-10-CM

## 2021-04-11 ENCOUNTER — Other Ambulatory Visit: Payer: Self-pay

## 2021-04-11 ENCOUNTER — Ambulatory Visit
Admission: RE | Admit: 2021-04-11 | Discharge: 2021-04-11 | Disposition: A | Discharge status: Home / Self Care | Payer: Medicare Other | Source: Ambulatory Visit | Attending: Family Medicine | Admitting: Family Medicine

## 2021-04-11 ENCOUNTER — Other Ambulatory Visit: Payer: Self-pay | Admitting: Family Medicine

## 2021-04-11 DIAGNOSIS — R928 Other abnormal and inconclusive findings on diagnostic imaging of breast: Secondary | ICD-10-CM

## 2021-04-11 DIAGNOSIS — N6489 Other specified disorders of breast: Secondary | ICD-10-CM

## 2021-04-27 ENCOUNTER — Other Ambulatory Visit: Payer: Self-pay

## 2021-04-27 ENCOUNTER — Ambulatory Visit
Admission: RE | Admit: 2021-04-27 | Discharge: 2021-04-27 | Disposition: A | Discharge status: Home / Self Care | Payer: Medicare Other | Source: Ambulatory Visit | Attending: Family Medicine | Admitting: Family Medicine

## 2021-04-27 DIAGNOSIS — N6489 Other specified disorders of breast: Secondary | ICD-10-CM

## 2022-03-30 ENCOUNTER — Other Ambulatory Visit: Payer: Self-pay | Admitting: Family Medicine

## 2022-03-30 DIAGNOSIS — G44209 Tension-type headache, unspecified, not intractable: Secondary | ICD-10-CM

## 2022-04-08 ENCOUNTER — Ambulatory Visit
Admission: RE | Admit: 2022-04-08 | Discharge: 2022-04-08 | Disposition: A | Discharge status: Home / Self Care | Payer: Medicare Other | Source: Ambulatory Visit | Attending: Family Medicine | Admitting: Family Medicine

## 2022-04-08 DIAGNOSIS — G44209 Tension-type headache, unspecified, not intractable: Secondary | ICD-10-CM

## 2022-10-18 ENCOUNTER — Other Ambulatory Visit: Payer: Self-pay | Admitting: Family Medicine

## 2022-10-18 DIAGNOSIS — E2839 Other primary ovarian failure: Secondary | ICD-10-CM

## 2022-11-01 IMAGING — MG MM DIGITAL SCREENING BILAT W/ TOMO AND CAD
8 series · 8 of 24 positions shown · non-contrast
Comparison: None.

CLINICAL DATA: Screening.

EXAM:
DIGITAL SCREENING BILATERAL MAMMOGRAM WITH TOMOSYNTHESIS AND CAD
TECHNIQUE: Bilateral screening digital craniocaudal and mediolateral oblique
mammograms were obtained. Bilateral screening digital breast
tomosynthesis was performed. The images were evaluated with
computer-aided detection.

[L MLO synth-2D]
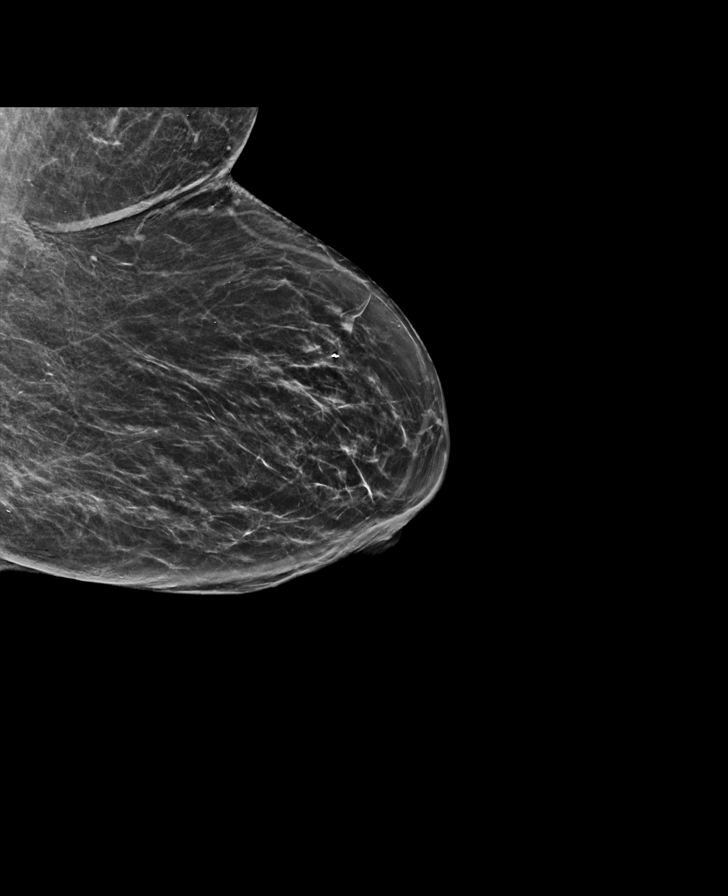

[R CC synth-2D]
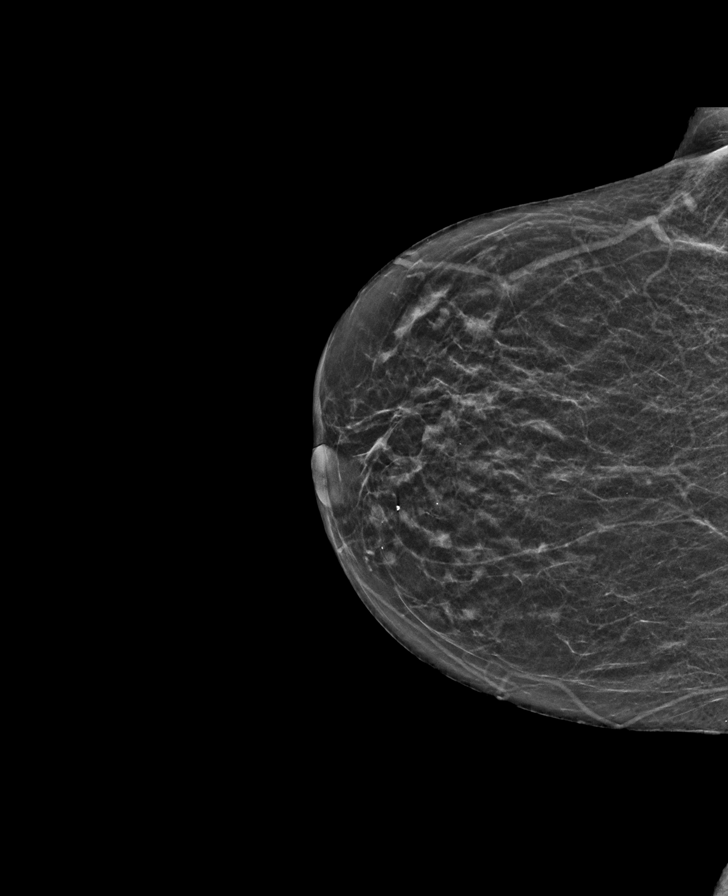

[L CC synth-2D]
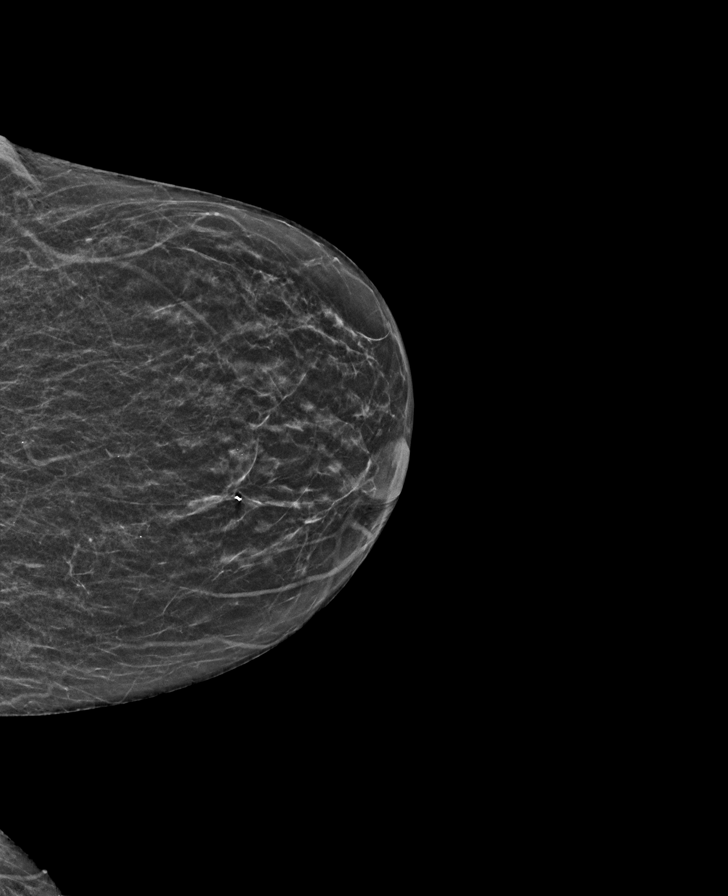

[R MLO synth-2D]
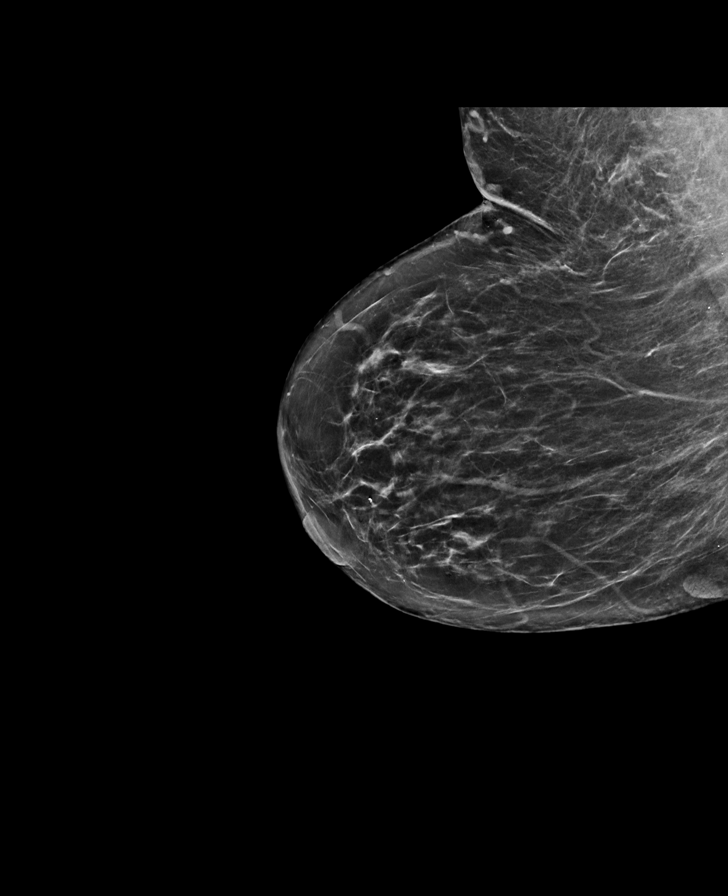

[L CC tomo · tomo slice 21/41.0]
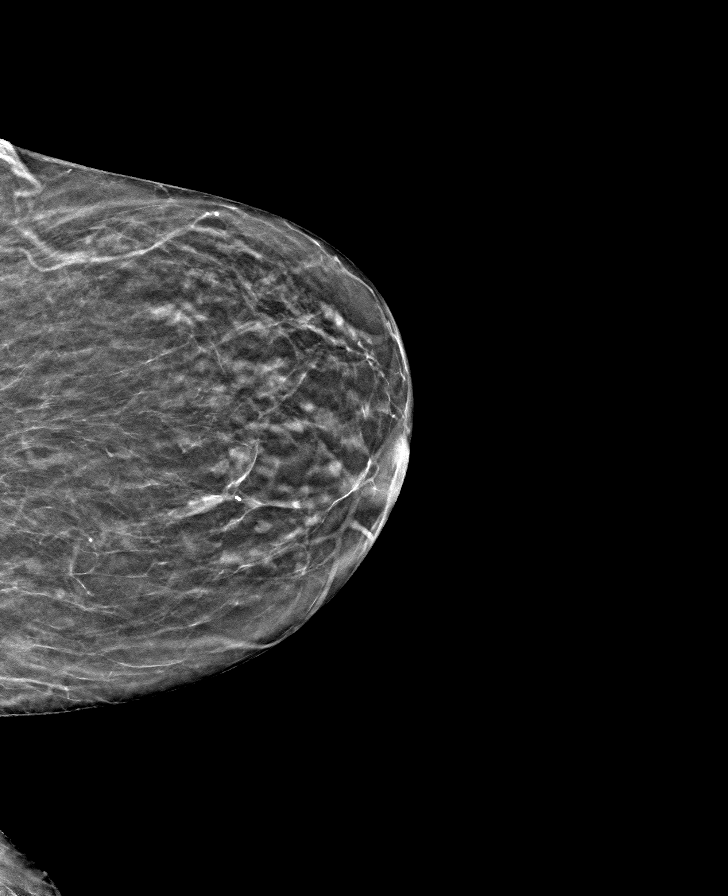

[R CC tomo · tomo slice 23/44.0]
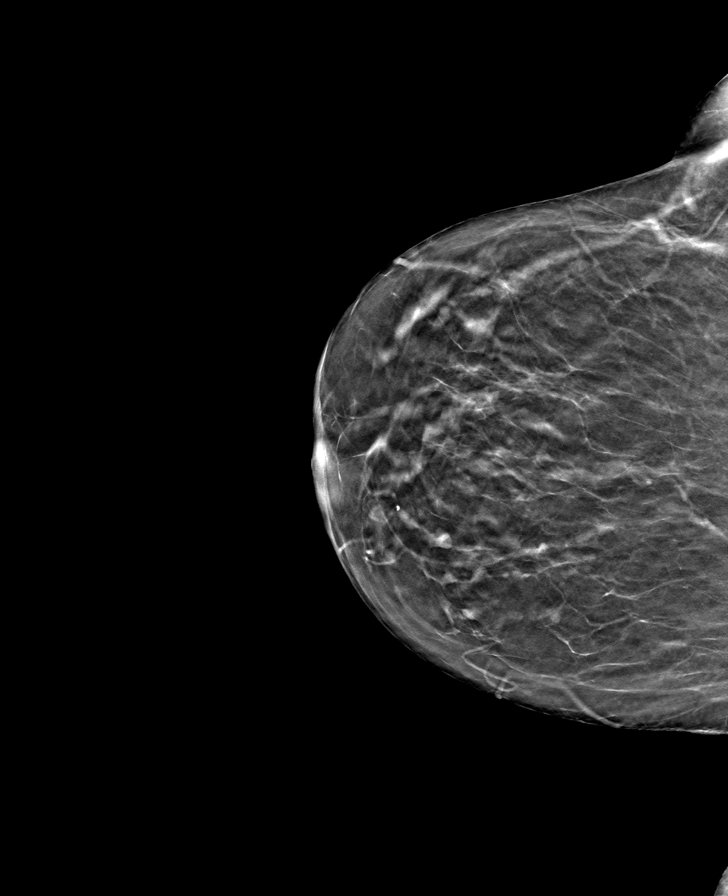

[L MLO tomo · tomo slice 29/57.0]
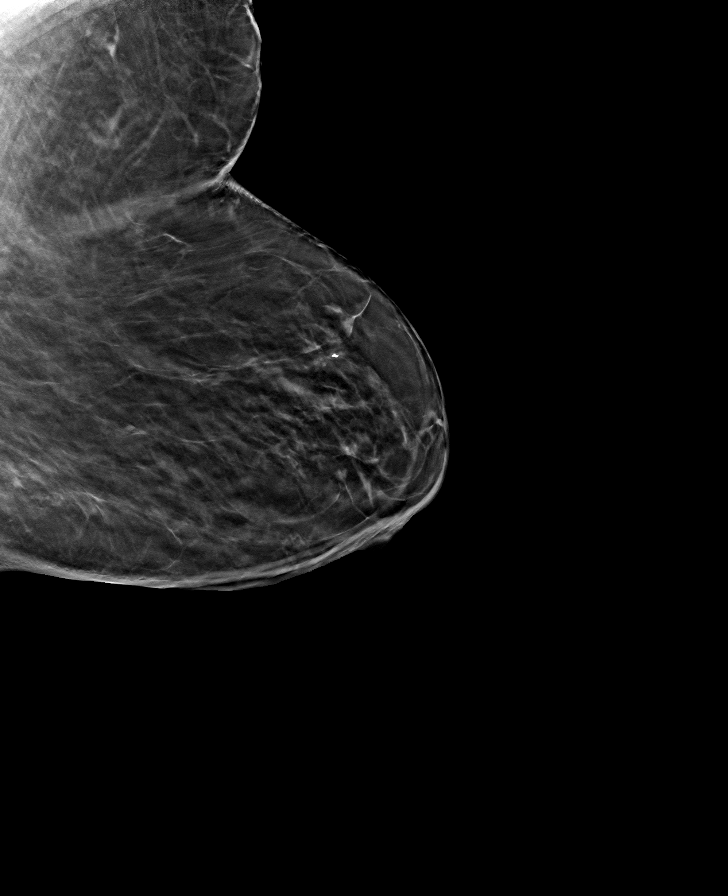

[R MLO tomo · tomo slice 31/60.0]
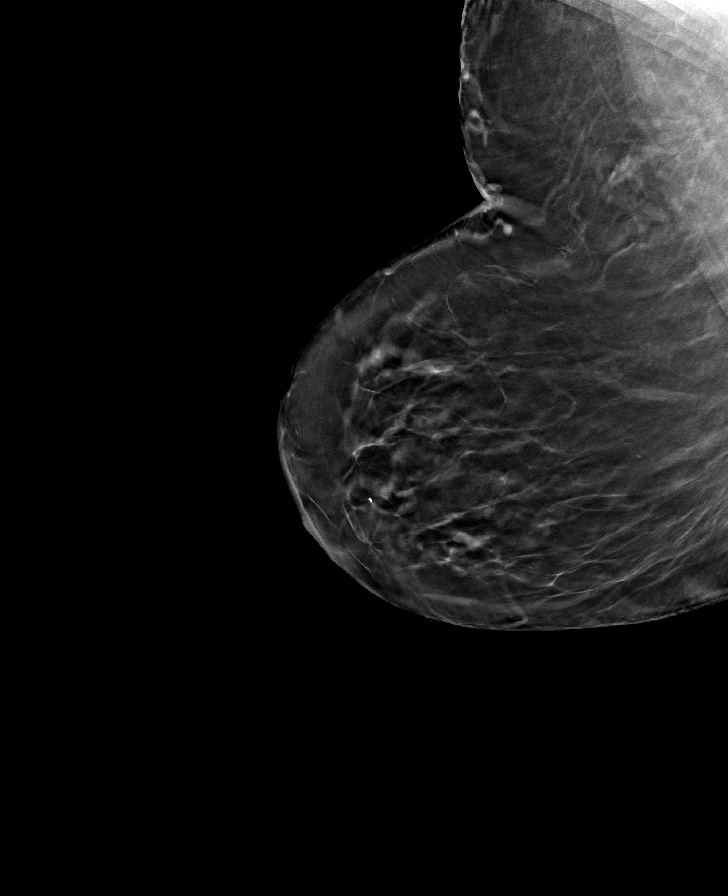

[8 of 24 positions shown; findings below may reference images not displayed]

ACR Breast Density Category c: The breast tissue is heterogeneously
dense, which may obscure small masses.
FINDINGS: In the right breast, a possible asymmetry warrants further
evaluation. In the left breast, no findings suspicious for
malignancy.
IMPRESSION: Further evaluation is suggested for possible asymmetry in the right
breast.

RECOMMENDATION:
Diagnostic mammogram and possibly ultrasound of the right breast.
(Code:[id number])

The patient will be contacted regarding the findings, and additional
imaging will be scheduled.

BI-RADS CATEGORY  0: Incomplete. Need additional imaging evaluation
and/or prior mammograms for comparison.

## 2023-04-17 ENCOUNTER — Ambulatory Visit
Admission: RE | Admit: 2023-04-17 | Discharge: 2023-04-17 | Disposition: A | Discharge status: Home / Self Care | Payer: Medicare Other | Source: Ambulatory Visit | Attending: Family Medicine | Admitting: Family Medicine

## 2023-04-17 DIAGNOSIS — E2839 Other primary ovarian failure: Secondary | ICD-10-CM
